# Patient Record
Sex: Female | Born: 1985 | Race: Black or African American | Hispanic: No | Marital: Single | Smoking: Former smoker
Health system: Southern US, Community
[De-identification: ages and names within clinical notes are randomized; demographics above are authoritative.]

## PROBLEM LIST (undated history)

## (undated) DIAGNOSIS — F419 Anxiety disorder, unspecified: Secondary | ICD-10-CM

## (undated) DIAGNOSIS — F17200 Nicotine dependence, unspecified, uncomplicated: Secondary | ICD-10-CM

## (undated) DIAGNOSIS — Z87440 Personal history of urinary (tract) infections: Secondary | ICD-10-CM

## (undated) HISTORY — DX: Nicotine dependence, unspecified, uncomplicated: F17.200

## (undated) HISTORY — DX: Personal history of urinary (tract) infections: Z87.440

---
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
## 2006-04-04 ENCOUNTER — Emergency Department (HOSPITAL_COMMUNITY): Admission: EM | Admit: 2006-04-04 | Discharge: 2006-04-04 | Payer: Self-pay | Admitting: Emergency Medicine

## 2007-01-17 ENCOUNTER — Ambulatory Visit: Payer: Self-pay | Admitting: Family Medicine

## 2007-02-17 ENCOUNTER — Ambulatory Visit: Payer: Self-pay | Admitting: Family Medicine

## 2007-03-24 ENCOUNTER — Ambulatory Visit: Payer: Self-pay | Admitting: Family Medicine

## 2007-08-15 ENCOUNTER — Ambulatory Visit: Payer: Self-pay | Admitting: Family Medicine

## 2007-10-05 ENCOUNTER — Other Ambulatory Visit: Admission: RE | Admit: 2007-10-05 | Discharge: 2007-10-05 | Payer: Self-pay | Admitting: Family Medicine

## 2007-10-05 ENCOUNTER — Ambulatory Visit: Payer: Self-pay | Admitting: Family Medicine

## 2008-11-01 ENCOUNTER — Ambulatory Visit: Payer: Self-pay | Admitting: Family Medicine

## 2009-01-08 ENCOUNTER — Ambulatory Visit: Payer: Self-pay | Admitting: Family Medicine

## 2009-01-08 ENCOUNTER — Other Ambulatory Visit: Admission: RE | Admit: 2009-01-08 | Discharge: 2009-01-08 | Payer: Self-pay | Admitting: Family Medicine

## 2009-01-08 LAB — HM PAP SMEAR: HM Pap smear: NEGATIVE

## 2010-06-07 ENCOUNTER — Encounter: Payer: Self-pay | Admitting: Internal Medicine

## 2010-09-23 ENCOUNTER — Emergency Department (HOSPITAL_COMMUNITY)
Admission: EM | Admit: 2010-09-23 | Discharge: 2010-09-23 | Disposition: A | Discharge status: Home / Self Care | Payer: Federal, State, Local not specified - PPO | Attending: Emergency Medicine | Admitting: Emergency Medicine

## 2010-09-23 DIAGNOSIS — M545 Low back pain, unspecified: Secondary | ICD-10-CM | POA: Insufficient documentation

## 2010-09-23 DIAGNOSIS — N390 Urinary tract infection, site not specified: Secondary | ICD-10-CM | POA: Insufficient documentation

## 2010-09-23 DIAGNOSIS — R300 Dysuria: Secondary | ICD-10-CM | POA: Insufficient documentation

## 2010-09-23 LAB — URINALYSIS, ROUTINE W REFLEX MICROSCOPIC
Bilirubin Urine: NEGATIVE
Glucose, UA: NEGATIVE mg/dL
Ketones, ur: NEGATIVE mg/dL
Nitrite: NEGATIVE
Protein, ur: 30 mg/dL — AB
Specific Gravity, Urine: 1.014 (ref 1.005–1.030)
Urobilinogen, UA: 0.2 mg/dL (ref 0.0–1.0)
pH: 6.5 (ref 5.0–8.0)

## 2010-09-23 LAB — URINE MICROSCOPIC-ADD ON

## 2010-09-23 LAB — POCT PREGNANCY, URINE: Preg Test, Ur: NEGATIVE

## 2010-10-07 ENCOUNTER — Other Ambulatory Visit: Payer: Self-pay | Admitting: Family Medicine

## 2010-10-07 ENCOUNTER — Encounter: Payer: Self-pay | Admitting: Medical

## 2010-10-07 ENCOUNTER — Ambulatory Visit (INDEPENDENT_AMBULATORY_CARE_PROVIDER_SITE_OTHER): Payer: Federal, State, Local not specified - PPO | Admitting: Medical

## 2010-10-07 DIAGNOSIS — L738 Other specified follicular disorders: Secondary | ICD-10-CM

## 2010-10-07 DIAGNOSIS — L739 Follicular disorder, unspecified: Secondary | ICD-10-CM

## 2010-10-07 DIAGNOSIS — R109 Unspecified abdominal pain: Secondary | ICD-10-CM

## 2010-10-07 DIAGNOSIS — N3090 Cystitis, unspecified without hematuria: Secondary | ICD-10-CM

## 2010-10-07 LAB — POCT URINALYSIS DIPSTICK
Leukocytes, UA: POSITIVE
Protein, UA: POSITIVE
Urobilinogen, UA: 1
pH, UA: 5

## 2010-10-07 MED ORDER — SULFAMETHOXAZOLE-TRIMETHOPRIM 400-80 MG PO TABS: 1.0000 | ORAL_TABLET | Freq: Two times a day (BID) | ORAL | Status: AC

## 2010-10-07 NOTE — Patient Instructions (Signed)
Urinary Tract Infection (UTI)   Infections of the urinary tract can start in several places. A bladder infection (cystitis), a kidney infection (pyelonephritis), and a prostate infection (prostatitis) are different types of urinary tract infections. They usually get better if treated with medicines (antibiotics) that kill germs. Take all the medicine until it is gone. You or your child may feel better in a few days, but TAKE ALL MEDICINE or the infection may not respond and may become more difficult to treat.   HOME CARE INSTRUCTIONS   Drink enough water and fluids to keep the urine clear or pale yellow. Cranberry juice is especially recommended, in addition to large amounts of water.   Avoid caffeine, tea, and carbonated beverages. They tend to irritate the bladder.   Alcohol may irritate the prostate.   Only take over-the-counter or prescription medicines for pain, discomfort, or fever as directed by your caregiver.   FINDING OUT THE RESULTS OF YOUR TEST   Not all test results are available during your visit. If your or your child's test results are not back during the visit, make an appointment with your caregiver to find out the results. Do not assume everything is normal if you have not heard from your caregiver or the medical facility. It is important for you to follow up on all test results.   TO PREVENT FURTHER INFECTIONS:   Empty the bladder often. Avoid holding urine for long periods of time.   After a bowel movement, women should cleanse from front to back. Use each tissue only once.   Empty the bladder before and after sexual intercourse.   SEEK MEDICAL CARE IF:   There is back pain.   You or your child has an oral temperature above 101.   Your baby is older than 3 months with a rectal temperature of 100.5º F (38.1° C) or higher for more than 1 day.   Your or your child's problems (symptoms) are no better in 3 days. Return sooner if you or your child is getting worse.   SEEK IMMEDIATE MEDICAL CARE IF:    There is severe back pain or lower abdominal pain.   You or your child develops chills.   You or your child has an oral temperature above 101, not controlled by medicine.   Your baby is older than 3 months with a rectal temperature of 102º F (38.9º C) or higher.   Your baby is 3 months old or younger with a rectal temperature of 100.4º F (38º C) or higher.   There is nausea or vomiting.   There is continued burning or discomfort with urination.   MAKE SURE YOU:   Understand these instructions.   Will watch this condition.   Will get help right away if you or your child is not doing well or gets worse.   Document Released: 02/10/2005 Document Re-Released: 07/28/2009   [employer]® Patient Information ©2011 [employer].

## 2010-10-07 NOTE — Progress Notes (Signed)
Subjective:    Dana Wells is a 25 y.o. female who complains of burning with urination, dysuria, frequency, hematuria and urgency. She has had symptoms for 1 week. Patient also complains of Lower abdominal pain and back pain. Patient denies fever and vaginal discharge. Patient does have a history of recurrent UTI. Patient does not have a history of pyelonephritis. She noticed that she was seen at the emergency department a week and a half ago for the same reason, placed on an antibiotic that she takes 4 times a day, but this has not helped. She continues to have the same symptoms. She notes having frequent urinary tract infections starting as a teenager, gets UTIs one to 2 times per year now, and has never seen a urologist.  She has a second complaint of a rash. She noticed the rashes under her breasts as well as in the pubic area. She gets these from time to time, occasionally there is some discharge from the rash, and thinks is related to sweating. She uses baby powders in these areas, but no other aggravating or relieving factors.  The following portions of the patient's history were reviewed and updated as appropriate: allergies, current medications, past family history, past medical history, past social history, past surgical history and problem list.  Review of Systems Constitutional: denies fever, chills, sweats Cardiology: denies chest pain, palpitations Respiratory: denies cough, shortness of breath Gastroenterology: denies abdominal pain, nausea, vomiting, diarrhea Urology: denies incontinence    Objective:   Filed Vitals:   10/07/10 1004  BP: 136/90  Pulse: 88  Temp: 97.5 F (36.4 C)    General appearence: alert, no distress, WD/WN, African American female Oral cavity: MMM Skin: Linear row of maculopapular 1 cm diameter erythematous lesions under both breasts and in the suprapubic region, no vesicles, no pustules, no fluctuance or induration Heart: RRR, normal S1, S2, no  murmurs Lungs: CTA bilaterally, no wheezes, rhonchi, or rales Abdomen: Positive suprapubic tenderness; +bs, soft, non distended, no masses, no hepatomegaly, no splenomegaly Back: no CVA tenderness Pulses: 2+ symmetric     Laboratory:  Urine dipstick:   Positive for blood, protein, leukocytes.   Assessment:   Encounter Diagnoses  Name Primary?  . Cystitis Yes  . Abdominal pain   . Folliculitis      Plan:     cystitis-prescribed Bactrim, hydrate well, discussed preventative measures, recheck in 2 weeks  Abdominal pain-recheck in 2 weeks  Folliculitis-resolving. Can continue to use drying powders to reduce moisture in the affected areas.

## 2010-10-09 LAB — URINE CULTURE

## 2010-10-13 ENCOUNTER — Ambulatory Visit: Payer: Federal, State, Local not specified - PPO | Admitting: Medical

## 2010-10-13 ENCOUNTER — Encounter: Payer: Self-pay | Admitting: Medical

## 2010-10-14 ENCOUNTER — Telehealth: Payer: Self-pay

## 2010-10-14 NOTE — Telephone Encounter (Signed)
Have called pt 2 times phone has stayed busy cell and work # is no good

## 2010-10-19 ENCOUNTER — Telehealth: Payer: Self-pay

## 2010-10-19 NOTE — Telephone Encounter (Signed)
Called pt 3 times and no response to calls that she needed to come in and redo ua

## 2010-10-21 ENCOUNTER — Ambulatory Visit: Payer: Federal, State, Local not specified - PPO | Admitting: Medical

## 2010-10-22 ENCOUNTER — Ambulatory Visit: Payer: Federal, State, Local not specified - PPO | Admitting: Medical

## 2010-10-26 ENCOUNTER — Encounter: Payer: Self-pay | Admitting: Family Medicine

## 2010-10-26 ENCOUNTER — Ambulatory Visit (INDEPENDENT_AMBULATORY_CARE_PROVIDER_SITE_OTHER): Payer: Federal, State, Local not specified - PPO | Admitting: Family Medicine

## 2010-10-26 ENCOUNTER — Ambulatory Visit: Payer: Federal, State, Local not specified - PPO | Admitting: Family Medicine

## 2010-10-26 VITALS — BP 130/80 | HR 70 | Temp 98.3°F | Wt 172.0 lb

## 2010-10-26 DIAGNOSIS — L0291 Cutaneous abscess, unspecified: Secondary | ICD-10-CM

## 2010-10-26 DIAGNOSIS — N390 Urinary tract infection, site not specified: Secondary | ICD-10-CM

## 2010-10-26 DIAGNOSIS — L0390 Cellulitis, unspecified: Secondary | ICD-10-CM

## 2010-10-26 DIAGNOSIS — M545 Low back pain: Secondary | ICD-10-CM

## 2010-10-26 LAB — POCT URINALYSIS DIPSTICK
Bilirubin, UA: NEGATIVE
Ketones, UA: NEGATIVE
Spec Grav, UA: 1.02
pH, UA: 5

## 2010-10-26 MED ORDER — DOXYCYCLINE HYCLATE 100 MG PO TABS: 100.0000 mg | ORAL_TABLET | Freq: Two times a day (BID) | ORAL | Status: AC

## 2010-10-26 NOTE — Progress Notes (Signed)
  Subjective:    Patient ID: Dana Wells, female    DOB: [birth year], 25 y.o.   MRN: [id number]  HPI she is here for a recheck. He states that the dysuria has diminished however she continues to have difficulty with back pain. The back pain is made worse with physical activities with motion in any direction. She has no numbness tingling or weakness. She also continues to have difficulty with lesions under her breasts and states the antibiotic did not help with this.    Review of Systems     Objective:   Physical Exam alert and in no distress. Lesions in various stages of healing are noted underneath both breasts. Underneath left breast there was one that had come to a head.   urinalysis microscopic was contaminated. Back exam shows tenderness to palpation over her lumbar area as well as SI joint. Good motion of the back the pain with motion. Negative straight leg raising. Normal DTRs.        Assessment & Plan:   mechanical low back pain. Multiple abscesses. I instructed her on proper care of her back in terms of lifting sitting standing. Recommend heat and ibuprofen. I will also place her on doxycycline. A culture was taken from the lesion under the left breast.

## 2010-10-26 NOTE — Patient Instructions (Addendum)
Take 4 Advil 3 times per day. Also heat to back for 20 minutes 3 times per day. Proper posturing in regard to lifting, sitting and standing. Do this for 2 weeks and if no better make an employment for followup I will call you with the results of the culture. Also use Hibiclens couple times per week

## 2010-10-29 LAB — WOUND CULTURE
Gram Stain: NONE SEEN
Gram Stain: NONE SEEN
Organism ID, Bacteria: NO GROWTH

## 2010-12-07 ENCOUNTER — Ambulatory Visit: Payer: Federal, State, Local not specified - PPO | Admitting: Family Medicine

## 2010-12-11 ENCOUNTER — Ambulatory Visit: Payer: Federal, State, Local not specified - PPO | Admitting: Family Medicine

## 2011-01-28 ENCOUNTER — Telehealth: Payer: Self-pay | Admitting: Family Medicine

## 2011-01-28 NOTE — Telephone Encounter (Signed)
LETTER SENT

## 2011-02-16 ENCOUNTER — Ambulatory Visit (INDEPENDENT_AMBULATORY_CARE_PROVIDER_SITE_OTHER): Payer: Federal, State, Local not specified - PPO | Admitting: Medical

## 2011-02-16 ENCOUNTER — Encounter: Payer: Self-pay | Admitting: Medical

## 2011-02-16 VITALS — BP 120/80 | HR 80 | Temp 98.4°F | Resp 16 | Ht 68.0 in | Wt 175.0 lb

## 2011-02-16 DIAGNOSIS — J029 Acute pharyngitis, unspecified: Secondary | ICD-10-CM

## 2011-02-16 DIAGNOSIS — R509 Fever, unspecified: Secondary | ICD-10-CM

## 2011-02-16 DIAGNOSIS — R05 Cough: Secondary | ICD-10-CM

## 2011-02-16 LAB — CBC WITH DIFFERENTIAL/PLATELET
Basophils Absolute: 0 10*3/uL (ref 0.0–0.1)
Basophils Relative: 0 % (ref 0–1)
HCT: 37 % (ref 36.0–46.0)
Lymphocytes Relative: 18 % (ref 12–46)
MCHC: 33.8 g/dL (ref 30.0–36.0)
Neutro Abs: 6.7 10*3/uL (ref 1.7–7.7)
Neutrophils Relative %: 75 % (ref 43–77)
RDW: 13.3 % (ref 11.5–15.5)
WBC: 9 10*3/uL (ref 4.0–10.5)

## 2011-02-16 MED ORDER — ANTIPYRINE-BENZOCAINE 5.4-1.4 % OT SOLN: 3.0000 [drp] | OTIC | Status: AC | PRN

## 2011-02-16 MED ORDER — PROMETHAZINE-DM 6.25-15 MG/5ML PO SYRP: ORAL_SOLUTION | ORAL | Status: DC

## 2011-02-16 NOTE — Progress Notes (Signed)
Subjective:     Dana Wells is a 25 y.o. female who presents for evaluation of chills, fever, productive cough, sore throat and left ear pain, swollen glands, sinus pressure, headahce, and neck discomfort x 3 days..  Symptoms have been gradually worsening since that time. Treatment to date: Theraflu.  Denies sick contacts.  No other aggravating or relieving factors.  No other c/o.  She has missed school due to symptoms.  She notes that she feels like she has the flu.    The following portions of the patient's history were reviewed and updated as appropriate: allergies, current medications, past family history, past medical history, past social history, past surgical history and problem list.  Past Medical History  Diagnosis Date  . Tobacco use disorder   . History of recurrent urinary tract infection     Review of Systems Constitutional: +low grade fever, chills, anorexia; denies sweats Skin: denies rash HEENT: +sore throat, left ear pain; denies itchy watery eyes Cardiovascular: denies chest pain, palpitations Lungs: +mild cough with some productive sputum; denies wheezing, hemoptysis, orthopnea, PND Abdomen: +nausea; denies abdominal pain, vomiting, diarrhea GU: denies dysuria Extremities: +myalgias; denies edema, arthralgias  Objective:   Filed Vitals:   02/16/11 1444  BP: 120/80  Pulse: 80  Temp: 98.4 F (36.9 C)  Resp: 16    General appearance: Alert, WD/WN, no distress, ill appearing                             Skin: warm, no rash, no diaphoresis                           Head: no sinus tenderness                            Eyes: conjunctiva normal, corneas clear, PERRLA                            Ears: pearly TMs, external ear canals normal                          Nose: septum midline, turbinates swollen, with erythema and clear discharge             Mouth/throat: MMM, tongue normal, mild pharyngeal erythema                           Neck: supple, no adenopathy, no  thyromegaly, nontender                          Heart: RRR, normal S1, S2, no murmurs                         Lungs: CTA, no rhonchi, no wheezes, no rales                Extremities: no edema, nontender Abdomen: nontender, no mass, no organomegaly, non distended     Assessment:   Encounter Diagnoses  Name Primary?  . Pharyngitis Yes  . Fever   . Cough     Plan:   Prescription given today for Promethazine DM and Auralgan drops as below.  Strep and flu rapid tests negative.   Will send CBC.  Discussed diagnosis and treatment of what appears to be viral/flu like illness.  Suggested symptomatic OTC remedies for cough and congestion.  Nasal saline spray for nasal congestion.  Tylenol or Ibuprofen OTC for fever and malaise.  Call/return in 2-3 days if symptoms are worse or not improving. Gave note for school.

## 2011-02-16 NOTE — Patient Instructions (Signed)
Rest, hydrate well with water, Gatorade, ice chips, etc.    Use 3 tablets of the OTC Ibuprofen for neck and throat pain.  I wrote 2 prescriptions today, 1 is an ear drop for pain, the other is a syrup for cough and nausea.   Call if worse or not improving by Friday.

## 2011-02-18 ENCOUNTER — Telehealth: Payer: Self-pay | Admitting: Medical

## 2011-02-18 ENCOUNTER — Emergency Department (HOSPITAL_COMMUNITY)
Admission: EM | Admit: 2011-02-18 | Discharge: 2011-02-19 | Disposition: A | Discharge status: Home / Self Care | Payer: Federal, State, Local not specified - PPO | Attending: Emergency Medicine | Admitting: Emergency Medicine

## 2011-02-18 DIAGNOSIS — R51 Headache: Secondary | ICD-10-CM | POA: Insufficient documentation

## 2011-02-18 NOTE — Progress Notes (Signed)
Pt called headaches are uncontrollable and her 3 Ibuprofen per day not working.  Please call her in something stronger to [facility] or advise her how to handle the HA.  Please let pt know either way.  Advised her of lab results.

## 2011-02-18 NOTE — Telephone Encounter (Signed)
She called and stated that she is feeling a little better, threw up the cough syrup once, but wants something for headache.  She will take Ibuprofen 200mg , 4 tablets every 6 hours for the next 24 hours prn.  Call if worse or not improving.

## 2011-02-19 ENCOUNTER — Encounter (HOSPITAL_COMMUNITY): Payer: Self-pay

## 2011-02-19 ENCOUNTER — Emergency Department (HOSPITAL_COMMUNITY): Payer: Federal, State, Local not specified - PPO

## 2011-02-19 LAB — BASIC METABOLIC PANEL
CO2: 26 mEq/L (ref 19–32)
Calcium: 9.6 mg/dL (ref 8.4–10.5)
Glucose, Bld: 89 mg/dL (ref 70–99)
Sodium: 137 mEq/L (ref 135–145)

## 2011-02-19 LAB — CBC
HCT: 37.1 % (ref 36.0–46.0)
Hemoglobin: 12.6 g/dL (ref 12.0–15.0)
MCH: 30 pg (ref 26.0–34.0)
MCV: 88.3 fL (ref 78.0–100.0)
Platelets: 379 10*3/uL (ref 150–400)
RBC: 4.2 MIL/uL (ref 3.87–5.11)

## 2011-02-20 ENCOUNTER — Emergency Department (HOSPITAL_COMMUNITY)
Admission: EM | Admit: 2011-02-20 | Discharge: 2011-02-20 | Disposition: A | Discharge status: Home / Self Care | Payer: Federal, State, Local not specified - PPO | Attending: Emergency Medicine | Admitting: Emergency Medicine

## 2011-02-20 DIAGNOSIS — Z79899 Other long term (current) drug therapy: Secondary | ICD-10-CM | POA: Insufficient documentation

## 2011-02-20 DIAGNOSIS — R51 Headache: Secondary | ICD-10-CM | POA: Insufficient documentation

## 2011-02-20 DIAGNOSIS — R112 Nausea with vomiting, unspecified: Secondary | ICD-10-CM | POA: Insufficient documentation

## 2011-02-20 LAB — POCT PREGNANCY, URINE: Preg Test, Ur: NEGATIVE

## 2011-02-24 ENCOUNTER — Encounter: Payer: Federal, State, Local not specified - PPO | Admitting: Family Medicine

## 2011-06-26 ENCOUNTER — Encounter (HOSPITAL_COMMUNITY): Payer: Self-pay | Admitting: *Deleted

## 2011-06-26 ENCOUNTER — Emergency Department (HOSPITAL_COMMUNITY)
Admission: EM | Admit: 2011-06-26 | Discharge: 2011-06-26 | Disposition: A | Discharge status: Home / Self Care | Payer: Federal, State, Local not specified - PPO | Attending: Emergency Medicine | Admitting: Emergency Medicine

## 2011-06-26 DIAGNOSIS — R300 Dysuria: Secondary | ICD-10-CM | POA: Insufficient documentation

## 2011-06-26 DIAGNOSIS — N390 Urinary tract infection, site not specified: Secondary | ICD-10-CM

## 2011-06-26 DIAGNOSIS — R109 Unspecified abdominal pain: Secondary | ICD-10-CM | POA: Insufficient documentation

## 2011-06-26 DIAGNOSIS — R10819 Abdominal tenderness, unspecified site: Secondary | ICD-10-CM | POA: Insufficient documentation

## 2011-06-26 DIAGNOSIS — R319 Hematuria, unspecified: Secondary | ICD-10-CM | POA: Insufficient documentation

## 2011-06-26 DIAGNOSIS — F17200 Nicotine dependence, unspecified, uncomplicated: Secondary | ICD-10-CM | POA: Insufficient documentation

## 2011-06-26 LAB — URINALYSIS, ROUTINE W REFLEX MICROSCOPIC
Glucose, UA: NEGATIVE mg/dL
Ketones, ur: NEGATIVE mg/dL
Nitrite: NEGATIVE
Specific Gravity, Urine: 1.025 (ref 1.005–1.030)
pH: 6.5 (ref 5.0–8.0)

## 2011-06-26 LAB — URINE MICROSCOPIC-ADD ON

## 2011-06-26 LAB — POCT PREGNANCY, URINE: Preg Test, Ur: NEGATIVE

## 2011-06-26 MED ORDER — CIPROFLOXACIN HCL 500 MG PO TABS: 500.0000 mg | ORAL_TABLET | Freq: Two times a day (BID) | ORAL | Status: DC

## 2011-06-26 MED ORDER — IBUPROFEN 800 MG PO TABS
800.0000 mg | ORAL_TABLET | Freq: Once | ORAL | Status: AC
Start: 2011-06-26 — End: 2011-06-26
  Administered 2011-06-26: 800 mg via ORAL
  Filled 2011-06-26: qty 1

## 2011-06-26 MED ORDER — HYDROCODONE-ACETAMINOPHEN 5-325 MG PO TABS: 1.0000 | ORAL_TABLET | Freq: Four times a day (QID) | ORAL | Status: AC | PRN

## 2011-06-26 MED ORDER — KETOROLAC TROMETHAMINE 60 MG/2ML IM SOLN
60.0000 mg | Freq: Once | INTRAMUSCULAR | Status: DC
  Filled 2011-06-26: qty 2

## 2011-06-26 NOTE — ED Provider Notes (Signed)
History     CSN: [id number]  Arrival date & time 06/26/11  1142   First MD Initiated Contact with Patient 06/26/11 1150      Chief Complaint  Patient presents with  . Flank Pain    Left  . Dysuria    (Consider location/radiation/quality/duration/timing/severity/associated sxs/prior treatment) HPI Comments: Patient reports several days of left flank pain, now with urinary pressure, dysuria, and hematuria.  The flank pain is sharp and constant, worse with urination.  States this feels like her previous UTI.  States she has been getting UTIs approximately twice a year since she was 26 years old, has never seen a urologist.  Denies fever, N/V, change in bowel habits including diarrhea, constipation, melena, hematochezia, denies abnormal vaginal discharge or bleeding.  LMP was 2 weeks ago.  The history is provided by the patient.    Past Medical History  Diagnosis Date  . Tobacco use disorder   . History of recurrent urinary tract infection     History reviewed. No pertinent past surgical history.  History reviewed. No pertinent family history.  History  Substance Use Topics  . Smoking status: Current Everyday Smoker -- 0.5 packs/day    Types: Cigarettes  . Smokeless tobacco: Never Used  . Alcohol Use: 0.5 oz/week    1 drink(s) per week     socially    OB History    Grav Para Term Preterm Abortions TAB SAB Ect Mult Living                  Review of Systems  Respiratory: Negative for cough and shortness of breath.   Cardiovascular: Negative for chest pain.  All other systems reviewed and are negative.    Allergies  Food allergy formula  Home Medications   Current Outpatient Rx  Name Route Sig Dispense Refill  . ACETAMINOPHEN 500 MG PO TABS Oral Take 500 mg by mouth every 6 (six) hours as needed. pain    . IBUPROFEN 200 MG PO TABS Oral Take 400 mg by mouth every 6 (six) hours as needed. pain    . NAPROXEN SODIUM 220 MG PO TABS Oral Take 220 mg by mouth 2 (two)  times daily with a meal.      BP 111/66  Pulse 88  Temp(Src) 98.6 F (37 C) (Oral)  Resp 16  Wt 175 lb (79.379 kg)  SpO2 100%  LMP 06/16/2011  Physical Exam  Nursing note and vitals reviewed. Constitutional: She is oriented to person, place, and time. She appears well-developed and well-nourished.  HENT:  Head: Normocephalic and atraumatic.  Neck: Neck supple.  Cardiovascular: Normal rate, regular rhythm and normal heart sounds.   Pulmonary/Chest: Breath sounds normal. No respiratory distress. She has no wheezes. She has no rales. She exhibits no tenderness.  Abdominal: Soft. Bowel sounds are normal. She exhibits no distension and no mass. There is tenderness. There is CVA tenderness. There is no rebound and no guarding.       Diffuse left sided abdominal tenderness.  No localized tenderness.  Bilateral CVA tenderness.    Neurological: She is alert and oriented to person, place, and time.  Psychiatric: She has a normal mood and affect. Her behavior is normal.    ED Course  Procedures (including critical care time)  Labs Reviewed  URINALYSIS, ROUTINE W REFLEX MICROSCOPIC - Abnormal; Notable for the following:    APPearance TURBID (*)    Hgb urine dipstick LARGE (*)    Protein, ur 100 (*)  Leukocytes, UA LARGE (*)    All other components within normal limits  URINE MICROSCOPIC-ADD ON - Abnormal; Notable for the following:    Bacteria, UA FEW (*)    All other components within normal limits  POCT PREGNANCY, URINE  URINE CULTURE   No results found.   1. UTI (lower urinary tract infection)       MDM  Nontoxic, afebrile patient with recurrent UTIs presents with constant left flank pain, urinary "pressure," dysuria, hematuria.  No fever, vomiting, vaginal or bowel symptoms.  Patient d/c home with antibiotics, pain medication, urology follow up for investigation of recurrent infections.  Pt to return for worsening symptoms.  Patient verbalizes understanding and agrees  with plan. Rise Patience, [place] 06/26/11 1547

## 2011-06-26 NOTE — ED Provider Notes (Signed)
26 year old female with left flank pain for 3 days and dysuria today. She has not had any fever or chills and has not had any nausea or vomiting. Urinalysis confirms UTI. She is sent home on antibiotics.  Dione Booze, MD 06/26/11 1328

## 2011-06-26 NOTE — ED Notes (Signed)
Pt from home c/o left flank pain and dysuria since yesterday. Pt denies N/V/D.

## 2011-06-27 NOTE — ED Provider Notes (Signed)
Medical screening examination/treatment/procedure(s) were performed by non-physician practitioner and as supervising physician I was immediately available for consultation/collaboration.   Dione Booze, MD 06/27/11 [phone]

## 2011-06-30 ENCOUNTER — Other Ambulatory Visit: Payer: Self-pay | Admitting: Family Medicine

## 2011-06-30 ENCOUNTER — Encounter: Payer: Self-pay | Admitting: Family Medicine

## 2011-06-30 ENCOUNTER — Other Ambulatory Visit (HOSPITAL_COMMUNITY)
Admission: RE | Admit: 2011-06-30 | Discharge: 2011-06-30 | Disposition: A | Discharge status: Home / Self Care | Payer: Federal, State, Local not specified - PPO | Source: Ambulatory Visit | Attending: Family Medicine | Admitting: Family Medicine

## 2011-06-30 ENCOUNTER — Ambulatory Visit (INDEPENDENT_AMBULATORY_CARE_PROVIDER_SITE_OTHER): Payer: Federal, State, Local not specified - PPO | Admitting: Family Medicine

## 2011-06-30 VITALS — BP 120/70 | HR 70 | Ht 68.0 in | Wt 184.0 lb

## 2011-06-30 DIAGNOSIS — L089 Local infection of the skin and subcutaneous tissue, unspecified: Secondary | ICD-10-CM

## 2011-06-30 DIAGNOSIS — Z87440 Personal history of urinary (tract) infections: Secondary | ICD-10-CM

## 2011-06-30 DIAGNOSIS — Z0000 Encounter for general adult medical examination without abnormal findings: Secondary | ICD-10-CM

## 2011-06-30 DIAGNOSIS — L723 Sebaceous cyst: Secondary | ICD-10-CM

## 2011-06-30 DIAGNOSIS — Z01419 Encounter for gynecological examination (general) (routine) without abnormal findings: Secondary | ICD-10-CM | POA: Insufficient documentation

## 2011-06-30 MED ORDER — DOXYCYCLINE HYCLATE 100 MG PO TABS: 100.0000 mg | ORAL_TABLET | Freq: Two times a day (BID) | ORAL | Status: AC

## 2011-06-30 NOTE — Progress Notes (Signed)
Subjective:    Patient ID: Dana Wells, female    DOB: May 16, 1986, 26 y.o.   MRN: [id number]  HPI She has a two-week history of bilateral flank pain is made worse with standing or sitting for more than 10 or 20 minutes. No nausea, vomiting, diarrhea. No affect by food or bowel movements. She does occasionally take at all and gets some benefit from this. She also has a history of having 3 UTIs since August of last year. The most recent evaluation was in the emergency room. That record was reviewed. She also has a history of folliculitis and was seen by dermatology and placed on doxycycline. She mainly complains of lesions under both breasts and in the inguinal area.   Review of Systems  Constitutional: Negative.   HENT: Negative.   Eyes: Negative.   Respiratory: Negative.   Cardiovascular: Negative.   Genitourinary: Positive for dysuria, frequency, flank pain and difficulty urinating.  Musculoskeletal: Negative.   Skin: Negative.   Psychiatric/Behavioral: Negative.        Objective:   Physical Exam BP 120/70  Pulse 70  Ht 5\' 8"  (1.727 m)  Wt 184 lb (83.462 kg)  BMI 27.98 kg/m2  LMP 06/16/2011  General Appearance:    Alert, cooperative, no distress, appears stated age  Head:    Normocephalic, without obvious abnormality, atraumatic  Eyes:    PERRL, conjunctiva/corneas clear, EOM's intact, fundi    benign  Ears:    Normal TM's and external ear canals  Nose:   Nares normal, mucosa normal, no drainage or sinus   tenderness  Throat:   Lips, mucosa, and tongue normal; teeth and gums normal  Neck:   Supple, no lymphadenopathy;  thyroid:  no   enlargement/tenderness/nodules; no carotid   bruit or JVD  Back:    Spine nontender, no curvature, ROM normal, no CVA     tenderness  Lungs:     Clear to auscultation bilaterally without wheezes, rales or     ronchi; respirations unlabored  Chest Wall:    No tenderness or deformity   Heart:    Regular rate and rhythm, S1 and S2 normal, no  murmur, rub   or gallop  Breast Exam:    No tenderness, masses, or nipple discharge or inversion.      No axillary lymphadenopathy  Abdomen:     Soft, non-tender, nondistended, normoactive bowel sounds,    no masses, no hepatosplenomegaly  Genitalia:    Normal external genitalia without lesions.  BUS and vagina normal; cervix without lesions, or cervical motion tenderness. No abnormal vaginal discharge.  Uterus and adnexa not enlarged, nontender, no masses.  Pap performed  Rectal:    Not performed due to age<40 and no related complaints  Extremities:   No clubbing, cyanosis or edema  Pulses:   2+ and symmetric all extremities  Skin:   Skin color, texture, turgor normal, multiple sebaceous lesions are noted under both breasts and in the  Inner thighs bilaterally. 1 on the right eye was quite red and tender. Purulent material was expressed from this lesion it was cultured   Lymph nodes:   Cervical, supraclavicular, and axillary nodes normal  Neurologic:   CNII-XII intact, normal strength, sensation and gait; reflexes 2+ and symmetric throughout          Psych:   Normal mood, affect, hygiene and grooming.           Assessment & Plan:   1. Infected sebaceous cyst  Culture, routine-abscess  2. History of UTI    3. Routine general medical examination at a health care facility  Cytology - PAP   discuss her smoking and recommend she quit. She will continue to use condoms for birth control since she does not want to be placed on birth control pills. We'll give her doxycycline. Instructed her to call me if any of these lesions get larger and need to be opened. Recommend Advil for her pain. She is to return here in 10 days for repeat urinalysis.

## 2011-06-30 NOTE — Patient Instructions (Addendum)
Take all the antibiotic and I do want to have any left over. Use Dial soap. If the cysts under your breasts get worse call me for an appointment Take Advil for your pain. You can take as many as 12 per day if you need to.

## 2011-07-03 LAB — CULTURE, ROUTINE-ABSCESS: Gram Stain: NONE SEEN

## 2011-07-05 ENCOUNTER — Encounter: Payer: Self-pay | Admitting: Internal Medicine

## 2011-07-12 ENCOUNTER — Other Ambulatory Visit: Payer: Federal, State, Local not specified - PPO

## 2011-08-02 DIAGNOSIS — Z0279 Encounter for issue of other medical certificate: Secondary | ICD-10-CM

## 2011-08-16 HISTORY — PX: THERAPEUTIC ABORTION: SHX798

## 2011-08-23 ENCOUNTER — Ambulatory Visit (INDEPENDENT_AMBULATORY_CARE_PROVIDER_SITE_OTHER): Payer: Federal, State, Local not specified - PPO | Admitting: Family Medicine

## 2011-08-23 ENCOUNTER — Ambulatory Visit: Payer: Federal, State, Local not specified - PPO | Admitting: Family Medicine

## 2011-08-23 ENCOUNTER — Encounter: Payer: Self-pay | Admitting: Family Medicine

## 2011-08-23 VITALS — BP 120/74 | HR 108 | Wt 191.0 lb

## 2011-08-23 DIAGNOSIS — N912 Amenorrhea, unspecified: Secondary | ICD-10-CM

## 2011-08-23 DIAGNOSIS — Z331 Pregnant state, incidental: Secondary | ICD-10-CM

## 2011-08-23 DIAGNOSIS — N390 Urinary tract infection, site not specified: Secondary | ICD-10-CM

## 2011-08-23 DIAGNOSIS — R319 Hematuria, unspecified: Secondary | ICD-10-CM

## 2011-08-23 LAB — POCT URINALYSIS DIPSTICK
Bilirubin, UA: NEGATIVE
Glucose, UA: NEGATIVE
Nitrite, UA: NEGATIVE
Urobilinogen, UA: NEGATIVE

## 2011-08-23 MED ORDER — CIPROFLOXACIN HCL 500 MG PO TABS: 500.0000 mg | ORAL_TABLET | Freq: Two times a day (BID) | ORAL | Status: AC

## 2011-08-23 NOTE — Progress Notes (Signed)
  Subjective:    Patient ID: Dana Wells, female    DOB: [birth year], 26 y.o.   MRN: [id number]  HPI She is here for evaluation of lower down no pressure and dysuria but no fever or chills. She also missed her last cycle and is concerned about pregnancy. She has never been pregnant before.   Review of Systems     Objective:   Physical Exam Alert and in no distress otherwise not examined       Assessment & Plan:   1. Amenorrhea  POCT urine pregnancy  2. Hematuria  POCT Urinalysis Dipstick  3. IUP (intrauterine pregnancy), incidental    4. UTI (lower urinary tract infection)     discuss her pregnancy with her. I discussed her options with her. She would like to have it terminated. A proper referral will be made. She was also placed on Cipro. She will return here as needed.

## 2011-12-06 ENCOUNTER — Ambulatory Visit (INDEPENDENT_AMBULATORY_CARE_PROVIDER_SITE_OTHER): Payer: Federal, State, Local not specified - PPO | Admitting: Medical

## 2011-12-06 ENCOUNTER — Encounter: Payer: Self-pay | Admitting: Medical

## 2011-12-06 ENCOUNTER — Emergency Department (HOSPITAL_COMMUNITY)
Admission: EM | Admit: 2011-12-06 | Discharge: 2011-12-08 | Disposition: A | Discharge status: Other Acute Inpatient Hospital | Payer: Federal, State, Local not specified - PPO

## 2011-12-06 VITALS — BP 120/70 | HR 82 | Wt 178.0 lb

## 2011-12-06 DIAGNOSIS — Z3201 Encounter for pregnancy test, result positive: Secondary | ICD-10-CM

## 2011-12-06 DIAGNOSIS — R58 Hemorrhage, not elsewhere classified: Secondary | ICD-10-CM

## 2011-12-06 DIAGNOSIS — N949 Unspecified condition associated with female genital organs and menstrual cycle: Secondary | ICD-10-CM

## 2011-12-06 DIAGNOSIS — N938 Other specified abnormal uterine and vaginal bleeding: Secondary | ICD-10-CM

## 2011-12-06 LAB — POCT URINE PREGNANCY: Preg Test, Ur: POSITIVE

## 2011-12-06 NOTE — Progress Notes (Signed)
Subjective:   HPI  Dana Wells is a 26 y.o. female who presents with prolonged menstrual bleeding.   She has a hx/o 1 pregnancy in April with TAB of that pregnancy.  She notes before April 2013 she had been on Nortel OCP for years.   Up until April, periods had always been every 28 days, normal cycle although the first few days always heavy bleeding.  She notes that she had been taking her OCP daily, every day, but not necessarily same time each day.  She ended up getting pregnancy in April while on the pill.  She ended up having a therapeutic abortion in April.  She was then switched to Sprintec which she has been taking every day.  She notes 2 normal periods since beginning Sprintec but last period started the end of June.  It was less heavy, more spotty, but has continued now about 2 weeks.  She stopped her Sprintec 2 days ago due to the bleeding and prolonged period.   She notes similar to April's unplanned pregnancy, she also now has belly tightness and uncomfortable feeling in her abdomen.  She sometimes uses condoms but not recently.  She notes that if she is pregnancy this time she wants to keep the pregnancy.  She has one monogamous partner.  She denies nausea, breast tenderness, or other symptoms. No other aggravating or relieving factors.    No other c/o.  The following portions of the patient's history were reviewed and updated as appropriate: allergies, current medications, past family history, past medical history, past social history, past surgical history and problem list.  Past Medical History  Diagnosis Date  . Tobacco use disorder   . History of recurrent urinary tract infection     Allergies  Allergen Reactions  . Food Allergy Formula Swelling    tomatoes   Review of Systems ROS reviewed and was negative other than noted in HPI or above.    Objective:   Physical Exam  General appearance: alert, no distress, WD/WN Abdomen: +bs, soft, tender left side throughout, but non  distended, no masses, no hepatomegaly, no splenomegaly   Assessment and Plan :     Encounter Diagnoses  Name Primary?  . Dysfunctional uterine bleeding Yes  . Pregnancy test-positive   . Bleeding    Urine prengancy positive today.  Will check beta HCG in light of April's situation with unplanned pregnancy while on OCPs.   She notes the desire to keep this pregnancy if it is positive.  We will have her sign to get copy of ultrasound from April.

## 2011-12-07 LAB — HCG, QUANTITATIVE, PREGNANCY: hCG, Beta Chain, Quant, S: <2 m[IU]/mL

## 2012-02-07 ENCOUNTER — Encounter: Payer: Self-pay | Admitting: Family Medicine

## 2012-02-07 ENCOUNTER — Ambulatory Visit (INDEPENDENT_AMBULATORY_CARE_PROVIDER_SITE_OTHER): Payer: Federal, State, Local not specified - PPO | Admitting: Family Medicine

## 2012-02-07 VITALS — BP 98/70 | HR 72 | Temp 98.6°F | Ht 67.0 in | Wt 173.0 lb

## 2012-02-07 DIAGNOSIS — R1013 Epigastric pain: Secondary | ICD-10-CM

## 2012-02-07 DIAGNOSIS — J069 Acute upper respiratory infection, unspecified: Secondary | ICD-10-CM

## 2012-02-07 DIAGNOSIS — R1110 Vomiting, unspecified: Secondary | ICD-10-CM

## 2012-02-07 DIAGNOSIS — M549 Dorsalgia, unspecified: Secondary | ICD-10-CM

## 2012-02-07 DIAGNOSIS — R51 Headache: Secondary | ICD-10-CM

## 2012-02-07 DIAGNOSIS — R109 Unspecified abdominal pain: Secondary | ICD-10-CM

## 2012-02-07 LAB — POCT URINALYSIS DIPSTICK
Bilirubin, UA: NEGATIVE
Glucose, UA: NEGATIVE
Spec Grav, UA: 1.02
Urobilinogen, UA: NEGATIVE
pH, UA: 5

## 2012-02-07 MED ORDER — ESOMEPRAZOLE MAGNESIUM 40 MG PO CPDR: 40.0000 mg | DELAYED_RELEASE_CAPSULE | Freq: Every day | ORAL | Status: DC

## 2012-02-07 NOTE — Patient Instructions (Addendum)
Headaches--suspect viral with sinus component.  Add decongestants (ie sudafed) and guaifenesin (ie mucinex or robitussin) to help thin out and clear sinuses.  You can also try sinus rinse kit or Neti-pot to help flush out/rinse sinuses and help with the headaches.  Chest pain and shortness of breath--can be due to chest congestion from post nasal drainage and GERD, especially given your pain in upper stomach.  Try and avoid spicy foods, caffeine and if you are taking ibuprofen, make sure to take with food, as this can bother the stomach further.  Take the Nexium samples daily to help make the stomach feel better.  Abnormal urine test--no urinary symptoms.  We will send the urine for culture and will call you if you need antibiotics.  Back pain--there is evidence of muscle spasm.  I recommend use of heat for 15 minutes at least 3 times daily, followed by massage and stretches.  Return in 1-2 weeks if you are having ongoing problems.  I recommend that you take a prenatal vitamin daily, given that you aren't using contraception regularly and could get pregnant.
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
## 2012-02-09 LAB — URINE CULTURE: Colony Count: >=100000

## 2012-02-10 ENCOUNTER — Other Ambulatory Visit (INDEPENDENT_AMBULATORY_CARE_PROVIDER_SITE_OTHER): Payer: Federal, State, Local not specified - PPO

## 2012-02-10 DIAGNOSIS — R350 Frequency of micturition: Secondary | ICD-10-CM

## 2012-02-10 LAB — POCT URINALYSIS DIPSTICK
Nitrite, UA: NEGATIVE
Protein, UA: NEGATIVE
pH, UA: 6

## 2012-02-12 LAB — CULTURE, URINE COMPREHENSIVE: Colony Count: 30000

## 2012-02-14 ENCOUNTER — Telehealth: Payer: Self-pay | Admitting: *Deleted

## 2012-02-14 NOTE — Telephone Encounter (Signed)
Patient notified of her culture results.

## 2012-02-14 NOTE — Telephone Encounter (Signed)
Patient called requesting urine culture results.

## 2012-02-14 NOTE — Telephone Encounter (Signed)
Advise pt that no significant infection is noted

## 2012-03-17 ENCOUNTER — Ambulatory Visit: Payer: Federal, State, Local not specified - PPO | Admitting: Medical

## 2012-04-11 ENCOUNTER — Encounter (HOSPITAL_COMMUNITY): Payer: Self-pay | Admitting: *Deleted

## 2012-04-11 ENCOUNTER — Emergency Department (HOSPITAL_COMMUNITY)
Admission: EM | Admit: 2012-04-11 | Discharge: 2012-04-11 | Disposition: A | Discharge status: Home / Self Care | Payer: Federal, State, Local not specified - PPO | Attending: Emergency Medicine | Admitting: Emergency Medicine

## 2012-04-11 DIAGNOSIS — R1013 Epigastric pain: Secondary | ICD-10-CM | POA: Insufficient documentation

## 2012-04-11 DIAGNOSIS — Z3202 Encounter for pregnancy test, result negative: Secondary | ICD-10-CM | POA: Insufficient documentation

## 2012-04-11 DIAGNOSIS — H53149 Visual discomfort, unspecified: Secondary | ICD-10-CM | POA: Insufficient documentation

## 2012-04-11 DIAGNOSIS — N390 Urinary tract infection, site not specified: Secondary | ICD-10-CM

## 2012-04-11 DIAGNOSIS — Z87891 Personal history of nicotine dependence: Secondary | ICD-10-CM | POA: Insufficient documentation

## 2012-04-11 DIAGNOSIS — Z87440 Personal history of urinary (tract) infections: Secondary | ICD-10-CM | POA: Insufficient documentation

## 2012-04-11 DIAGNOSIS — R197 Diarrhea, unspecified: Secondary | ICD-10-CM | POA: Insufficient documentation

## 2012-04-11 DIAGNOSIS — R51 Headache: Secondary | ICD-10-CM | POA: Insufficient documentation

## 2012-04-11 LAB — CBC WITH DIFFERENTIAL/PLATELET
Basophils Absolute: 0 10*3/uL (ref 0.0–0.1)
Basophils Relative: 0 % (ref 0–1)
Eosinophils Absolute: 0.1 10*3/uL (ref 0.0–0.7)
Eosinophils Relative: 2 % (ref 0–5)
HCT: 38.4 % (ref 36.0–46.0)
Lymphocytes Relative: 22 % (ref 12–46)
MCH: 30.2 pg (ref 26.0–34.0)
MCHC: 34.4 g/dL (ref 30.0–36.0)
MCV: 87.9 fL (ref 78.0–100.0)
Monocytes Absolute: 0.4 10*3/uL (ref 0.1–1.0)
RDW: 13 % (ref 11.5–15.5)

## 2012-04-11 LAB — COMPREHENSIVE METABOLIC PANEL
ALT: 10 U/L (ref 0–35)
AST: 16 U/L (ref 0–37)
Albumin: 3.8 g/dL (ref 3.5–5.2)
CO2: 27 mEq/L (ref 19–32)
Chloride: 103 mEq/L (ref 96–112)
GFR calc non Af Amer: >90 mL/min (ref >90)
Sodium: 139 mEq/L (ref 135–145)
Total Bilirubin: 0.5 mg/dL (ref 0.3–1.2)

## 2012-04-11 LAB — URINE MICROSCOPIC-ADD ON

## 2012-04-11 LAB — URINALYSIS, ROUTINE W REFLEX MICROSCOPIC
Bilirubin Urine: NEGATIVE
Glucose, UA: NEGATIVE mg/dL
Hgb urine dipstick: NEGATIVE
Ketones, ur: NEGATIVE mg/dL
Protein, ur: NEGATIVE mg/dL

## 2012-04-11 MED ORDER — SODIUM CHLORIDE 0.9 % IV SOLN
INTRAVENOUS | Status: DC
  Administered 2012-04-11: 13:00:00 via INTRAVENOUS

## 2012-04-11 MED ORDER — SODIUM CHLORIDE 0.9 % IV BOLUS (SEPSIS)
1000.0000 mL | Freq: Once | INTRAVENOUS | Status: AC
  Administered 2012-04-11: 1000 mL via INTRAVENOUS

## 2012-04-11 MED ORDER — METOCLOPRAMIDE HCL 5 MG/ML IJ SOLN
10.0000 mg | Freq: Once | INTRAMUSCULAR | Status: AC
  Administered 2012-04-11: 10 mg via INTRAVENOUS
  Filled 2012-04-11 (×2): qty 2

## 2012-04-11 MED ORDER — DIPHENHYDRAMINE HCL 50 MG/ML IJ SOLN
25.0000 mg | Freq: Once | INTRAMUSCULAR | Status: AC
  Administered 2012-04-11: 25 mg via INTRAVENOUS
  Filled 2012-04-11: qty 1

## 2012-04-11 MED ORDER — CEPHALEXIN 500 MG PO CAPS: 500.0000 mg | ORAL_CAPSULE | Freq: Four times a day (QID) | ORAL | Status: DC

## 2012-04-11 NOTE — ED Notes (Signed)
Pt from home with reports of N/V that started about a week ago, headache that started 2 weeks ago and diarrhea that started this morning. Pt also endorses intermittent fever for about a week. Pt also reports intermittent, generalized abdominal pain as well as urine odor but denies vaginal discharge or abnormal bleeding. Pt reports being sexually active with last period 03/05/12.

## 2012-04-11 NOTE — ED Provider Notes (Signed)
History     CSN: [id number]  Arrival date & time 04/11/12  1012   First MD Initiated Contact with Patient 04/11/12 1040      Chief Complaint  Patient presents with  . Nausea  . Headache  . Emesis  . Diarrhea    (Consider location/radiation/quality/duration/timing/severity/associated sxs/prior treatment) Patient is a 26 y.o. female presenting with headaches, vomiting, and diarrhea. The history is provided by the patient.  Headache  Associated symptoms include vomiting.  Emesis  Associated symptoms include diarrhea and headaches.  Diarrhea The primary symptoms include vomiting and diarrhea.   patient here with multiple complaints including nonspecific headache x2 weeks that is dull in nature and constant and localized at the base of her neck. Some photophobia without fever or neck pain. Also notes upper epigastric pain with associated nausea but no vomiting or diarrhea. Does note some malodorous urine. Seen by her Dr. recently for similar symptoms had a negative pregnancy test. Similar symptoms one year ago and had a head CT which is negative. No treatment used prior to arrival.  Past Medical History  Diagnosis Date  . Tobacco use disorder   . History of recurrent urinary tract infection     Past Surgical History  Procedure Date  . Therapeutic abortion 08/2011    History reviewed. No pertinent family history.  History  Substance Use Topics  . Smoking status: Former Smoker -- 0.5 packs/day    Types: Cigarettes    Quit date: 09/15/2011  . Smokeless tobacco: Never Used  . Alcohol Use: 0.5 oz/week    1 drink(s) per week     Comment: 3 drinks per weekend.    OB History    Grav Para Term Preterm Abortions TAB SAB Ect Mult Living                  Review of Systems  Gastrointestinal: Positive for vomiting and diarrhea.  Neurological: Positive for headaches.  All other systems reviewed and are negative.    Allergies  Food allergy formula  Home Medications    Current Outpatient Rx  Name  Route  Sig  Dispense  Refill  . ACETAMINOPHEN 500 MG PO TABS   Oral   Take 500 mg by mouth every 6 (six) hours as needed. pain         . IBUPROFEN 200 MG PO TABS   Oral   Take 400 mg by mouth every 6 (six) hours as needed. pain           BP 115/66  Pulse 97  Temp 98.3 F (36.8 C) (Oral)  Resp 16  Wt 170 lb (77.111 kg)  SpO2 100%  LMP 03/05/2012  Physical Exam  Nursing note and vitals reviewed. Constitutional: She is oriented to person, place, and time. She appears well-developed and well-nourished.  Non-toxic appearance. No distress.  HENT:  Head: Normocephalic and atraumatic.  Eyes: Conjunctivae normal, EOM and lids are normal. Pupils are equal, round, and reactive to light.  Neck: Normal range of motion. Neck supple. No tracheal deviation present. No mass present.  Cardiovascular: Normal rate, regular rhythm and normal heart sounds.  Exam reveals no gallop.   No murmur heard. Pulmonary/Chest: Effort normal and breath sounds normal. No stridor. No respiratory distress. She has no decreased breath sounds. She has no wheezes. She has no rhonchi. She has no rales.  Abdominal: Soft. Normal appearance and bowel sounds are normal. She exhibits no distension. There is tenderness in the epigastric area. There is no  rigidity, no rebound, no guarding and no CVA tenderness.  Musculoskeletal: Normal range of motion. She exhibits no edema and no tenderness.  Neurological: She is alert and oriented to person, place, and time. She has normal strength. No cranial nerve deficit or sensory deficit. GCS eye subscore is 4. GCS verbal subscore is 5. GCS motor subscore is 6.  Skin: Skin is warm and dry. No abrasion and no rash noted.  Psychiatric: She has a normal mood and affect. Her speech is normal and behavior is normal.    ED Course  Procedures (including critical care time)   Labs Reviewed  URINALYSIS, ROUTINE W REFLEX MICROSCOPIC  CBC WITH  DIFFERENTIAL  COMPREHENSIVE METABOLIC PANEL  LIPASE, BLOOD   No results found.   No diagnosis found.    MDM  Pt given meds for headache and will tx pt for uti        Toy Baker, MD 04/11/12 1329

## 2012-04-13 LAB — URINE CULTURE: Colony Count: >=100000

## 2012-04-14 NOTE — ED Notes (Signed)
+  Urine. Patient treated with Keflex. Sensitive to same. Per protocol MD. °

## 2012-07-07 ENCOUNTER — Ambulatory Visit: Payer: Federal, State, Local not specified - PPO | Admitting: Family Medicine

## 2012-07-07 ENCOUNTER — Encounter: Payer: Self-pay | Admitting: Family Medicine

## 2012-07-07 ENCOUNTER — Ambulatory Visit (INDEPENDENT_AMBULATORY_CARE_PROVIDER_SITE_OTHER): Payer: Federal, State, Local not specified - PPO | Admitting: Family Medicine

## 2012-07-07 VITALS — BP 120/80 | HR 70 | Wt 172.0 lb

## 2012-07-07 DIAGNOSIS — L738 Other specified follicular disorders: Secondary | ICD-10-CM

## 2012-07-07 DIAGNOSIS — L301 Dyshidrosis [pompholyx]: Secondary | ICD-10-CM

## 2012-07-07 DIAGNOSIS — L739 Follicular disorder, unspecified: Secondary | ICD-10-CM

## 2012-07-07 MED ORDER — DOXYCYCLINE HYCLATE 100 MG PO TABS: 100.0000 mg | ORAL_TABLET | Freq: Two times a day (BID) | ORAL | Status: DC

## 2012-07-07 NOTE — Patient Instructions (Signed)
Use cortisone cream at the base year thumb twice per day until it's under control then back off to daily and then back off to every other day etc. to keep it out of control. Take all the antibiotic and if you not totally back to normal give me a call

## 2012-07-07 NOTE — Addendum Note (Signed)
Addended by: Shaune Spittle D on: 07/07/2012 01:42 PM   Modules accepted: Orders

## 2012-07-07 NOTE — Progress Notes (Signed)
  Subjective:    Patient ID: Dana Wells, female    DOB: July 29, 1985, 27 y.o.   MRN: [id number]  HPI She complains of difficulty with a rash after using Darene Lamer. He also has had difficulty with sebaceous cyst said infections under her breasts and in her pubic area. She also complains of a rash present in the hypo-thenar area of both hands.   Review of Systems     Objective:   Physical Exam Exam of her skin does show some dyshidrotic changes present in the hypo-thenar area of both hands. No lesions are noted on her face or elbows or knees. She does have follicular type rash in various stages of healing with occasional maculopapular type lesions. Evidence of recent difficulty with sebaceous cyst infection is noted in the pubic area as well as under both breasts.       Assessment & Plan:  Folliculitis - Plan: doxycycline (VIBRA-TABS) 100 MG tablet  Dyshidrotic eczema she is to use the doxycycline however if she has difficulty with the abscesses under her breasts or any pelvic area, she is to let me know. Also recommend using cortisone cream on the hands to get the dyshidrotic changes under better control and then the mother control with continued use at a lessor dosing regimen.

## 2012-07-25 ENCOUNTER — Ambulatory Visit (INDEPENDENT_AMBULATORY_CARE_PROVIDER_SITE_OTHER): Payer: Federal, State, Local not specified - PPO | Admitting: Family Medicine

## 2012-07-25 ENCOUNTER — Encounter: Payer: Self-pay | Admitting: Family Medicine

## 2012-07-25 ENCOUNTER — Telehealth: Payer: Self-pay | Admitting: Family Medicine

## 2012-07-25 VITALS — BP 114/70 | HR 96 | Ht 67.0 in | Wt 168.0 lb

## 2012-07-25 DIAGNOSIS — N390 Urinary tract infection, site not specified: Secondary | ICD-10-CM

## 2012-07-25 DIAGNOSIS — N912 Amenorrhea, unspecified: Secondary | ICD-10-CM

## 2012-07-25 DIAGNOSIS — L0231 Cutaneous abscess of buttock: Secondary | ICD-10-CM

## 2012-07-25 DIAGNOSIS — R300 Dysuria: Secondary | ICD-10-CM

## 2012-07-25 LAB — POCT URINALYSIS DIPSTICK
Bilirubin, UA: NEGATIVE
Blood, UA: 250
Glucose, UA: NEGATIVE
Ketones, UA: NEGATIVE
Protein, UA: POSITIVE
Spec Grav, UA: 1.015
pH, UA: 5

## 2012-07-25 LAB — POCT URINE PREGNANCY: Preg Test, Ur: NEGATIVE

## 2012-07-25 MED ORDER — TRAMADOL HCL 50 MG PO TABS: 50.0000 mg | ORAL_TABLET | Freq: Three times a day (TID) | ORAL | Status: DC | PRN

## 2012-07-25 MED ORDER — DOXYCYCLINE HYCLATE 100 MG PO TABS: 100.0000 mg | ORAL_TABLET | Freq: Two times a day (BID) | ORAL | Status: DC

## 2012-07-25 MED ORDER — SULFAMETHOXAZOLE-TRIMETHOPRIM 800-160 MG PO TABS: 1.0000 | ORAL_TABLET | Freq: Two times a day (BID) | ORAL | Status: DC

## 2012-07-25 MED ORDER — LIDOCAINE HCL 1 % IJ SOLN
2.0000 mL | Freq: Once | INTRAMUSCULAR | Status: AC
  Administered 2012-07-25: 2 mL via INTRADERMAL

## 2012-07-25 NOTE — Progress Notes (Signed)
  Subjective:    Patient ID: Dana Wells, female    DOB: 1986/02/02, 27 y.o.   MRN: [id number]  HPI She is here for complete examination. She is concerned about pregnancy and did have one positive urine pregnancy test. She did have slight spotting with her last cycle but it lasted a shorter period of time and had minimal cramping. She does not use birth control. She also is having dysuria and urgency this started today. She also has a previous history of folliculitis and recently finished a course of doxycycline and noted lesion starting right after that in the gluteal area. It is getting worse and causing difficulty with sitting. He has no other concerns or questions. Social history was reviewed. She does have a steady boyfriend for the last year.   Review of Systems Negative except as above    Objective:   Physical Exam alert and in no distress. Tympanic membranes and canals are normal. Throat is clear. Tonsils are normal. Neck is supple without adenopathy or thyromegaly. Cardiac exam shows a regular sinus rhythm without murmurs or gallops. Lungs are clear to auscultation. Exam of the gluteal area shows a swollen tender lesion in the medial aspect of the left gluteus it is approximately 2-3 cm in size and fluctuant       Assessment & Plan:  Burning with urination - Plan: POCT Urinalysis Dipstick, Urine culture, doxycycline (VIBRA-TABS) 100 MG tablet  Absence of menstruation - Plan: POCT urine pregnancy, hCG, serum, qualitative  Cellulitis and abscess of buttock - Plan: lidocaine (XYLOCAINE) 1 % (with pres) injection 2 mL, sulfamethoxazole-trimethoprim (BACTRIM DS,SEPTRA DS) 800-160 MG per tablet  UTI (urinary tract infection) an I+Dwas performed on the lesion after Xylocaine was injected. Periodic material was expressed. It was packed with iodoform. She is to return here in 2 days for packing removal. I will call in the pain medication for her I will also place her on doxycycline pending  pregnancy test.

## 2012-07-26 ENCOUNTER — Ambulatory Visit (INDEPENDENT_AMBULATORY_CARE_PROVIDER_SITE_OTHER): Payer: Federal, State, Local not specified - PPO | Admitting: Medical

## 2012-07-26 ENCOUNTER — Encounter: Payer: Self-pay | Admitting: Medical

## 2012-07-26 DIAGNOSIS — L0231 Cutaneous abscess of buttock: Secondary | ICD-10-CM

## 2012-07-26 LAB — HCG, SERUM, QUALITATIVE: Preg, Serum: NEGATIVE

## 2012-07-26 NOTE — Progress Notes (Signed)
Subjective: Here for recheck.   Saw Dr. Susann Givens yesterday for I&D of buttock, right cheek.   Started antibiotics yesterday, but the packing came out.   She does report drainage.  She is using warm salt water soaks.   She is concerned that the pain is there, she sits all day at work, and can't bear to sit all day with this pain.   Worried also about seepage through clothes.    Objective: Right buttock along gluteal cleft with 2-3cm round indurated area, slightly erythematous, tender, but no fluctuance, no purulent drainage, and there is a 8-16mm linear surgical wound that is open.  Exam chaperoned by nurse  Assessment: Encounter Diagnosis  Name Primary?  . Cellulitis and abscess of buttock Yes   Plan: C/t same antibiotics, warm compresses, note give for work, c/t Ultram for pain, recheck Friday.

## 2012-07-27 ENCOUNTER — Ambulatory Visit: Payer: Federal, State, Local not specified - PPO | Admitting: Family Medicine

## 2012-07-28 ENCOUNTER — Emergency Department (HOSPITAL_COMMUNITY)
Admission: EM | Admit: 2012-07-28 | Discharge: 2012-07-28 | Disposition: A | Discharge status: Home / Self Care | Payer: Federal, State, Local not specified - PPO | Attending: Emergency Medicine | Admitting: Emergency Medicine

## 2012-07-28 ENCOUNTER — Encounter (HOSPITAL_COMMUNITY): Payer: Self-pay | Admitting: Emergency Medicine

## 2012-07-28 ENCOUNTER — Ambulatory Visit: Payer: Federal, State, Local not specified - PPO | Admitting: Family Medicine

## 2012-07-28 ENCOUNTER — Emergency Department (HOSPITAL_COMMUNITY): Payer: Federal, State, Local not specified - PPO

## 2012-07-28 DIAGNOSIS — R109 Unspecified abdominal pain: Secondary | ICD-10-CM

## 2012-07-28 DIAGNOSIS — Z87891 Personal history of nicotine dependence: Secondary | ICD-10-CM | POA: Insufficient documentation

## 2012-07-28 DIAGNOSIS — Z3202 Encounter for pregnancy test, result negative: Secondary | ICD-10-CM | POA: Insufficient documentation

## 2012-07-28 DIAGNOSIS — N390 Urinary tract infection, site not specified: Secondary | ICD-10-CM | POA: Insufficient documentation

## 2012-07-28 DIAGNOSIS — R112 Nausea with vomiting, unspecified: Secondary | ICD-10-CM | POA: Insufficient documentation

## 2012-07-28 DIAGNOSIS — R1031 Right lower quadrant pain: Secondary | ICD-10-CM | POA: Insufficient documentation

## 2012-07-28 LAB — CBC WITH DIFFERENTIAL/PLATELET
Basophils Absolute: 0 10*3/uL (ref 0.0–0.1)
Basophils Relative: 0 % (ref 0–1)
Eosinophils Absolute: 0 10*3/uL (ref 0.0–0.7)
Eosinophils Relative: 0 % (ref 0–5)
HCT: 40.4 % (ref 36.0–46.0)
Lymphocytes Relative: 3 % — ABNORMAL LOW (ref 12–46)
MCH: 30.3 pg (ref 26.0–34.0)
MCHC: 34.7 g/dL (ref 30.0–36.0)
MCV: 87.4 fL (ref 78.0–100.0)
Monocytes Absolute: 0.2 10*3/uL (ref 0.1–1.0)
Platelets: 371 10*3/uL (ref 150–400)
RDW: 12.7 % (ref 11.5–15.5)
WBC: 12.1 10*3/uL — ABNORMAL HIGH (ref 4.0–10.5)

## 2012-07-28 LAB — COMPREHENSIVE METABOLIC PANEL
ALT: 12 U/L (ref 0–35)
AST: 21 U/L (ref 0–37)
CO2: 24 mEq/L (ref 19–32)
Calcium: 9.5 mg/dL (ref 8.4–10.5)
Creatinine, Ser: 0.79 mg/dL (ref 0.50–1.10)
GFR calc non Af Amer: >90 mL/min (ref >90)
Sodium: 133 mEq/L — ABNORMAL LOW (ref 135–145)
Total Protein: 9.2 g/dL — ABNORMAL HIGH (ref 6.0–8.3)

## 2012-07-28 LAB — POCT I-STAT TROPONIN I

## 2012-07-28 LAB — URINALYSIS, MICROSCOPIC ONLY
Glucose, UA: NEGATIVE mg/dL
Leukocytes, UA: NEGATIVE
Protein, ur: NEGATIVE mg/dL
Specific Gravity, Urine: 1.025 (ref 1.005–1.030)
Urobilinogen, UA: 1 mg/dL (ref 0.0–1.0)

## 2012-07-28 LAB — URINE CULTURE: Colony Count: >=100000

## 2012-07-28 MED ORDER — IOHEXOL 300 MG/ML  SOLN
50.0000 mL | Freq: Once | INTRAMUSCULAR | Status: AC | PRN
  Administered 2012-07-28: 50 mL via ORAL

## 2012-07-28 MED ORDER — ONDANSETRON HCL 4 MG/2ML IJ SOLN
4.0000 mg | Freq: Once | INTRAMUSCULAR | Status: AC
  Administered 2012-07-28: 4 mg via INTRAVENOUS
  Filled 2012-07-28: qty 2

## 2012-07-28 MED ORDER — IOHEXOL 300 MG/ML  SOLN
100.0000 mL | Freq: Once | INTRAMUSCULAR | Status: AC | PRN
  Administered 2012-07-28: 100 mL via INTRAVENOUS

## 2012-07-28 MED ORDER — SODIUM CHLORIDE 0.9 % IV SOLN
1000.0000 mL | Freq: Once | INTRAVENOUS | Status: AC
  Administered 2012-07-28: 1000 mL via INTRAVENOUS

## 2012-07-28 NOTE — ED Provider Notes (Signed)
  Physical Exam  BP 118/68  Pulse 92  Temp(Src) 98.2 F (36.8 C) (Oral)  Resp 19  SpO2 100%  LMP 07/16/2012  Physical Exam CT Scan reviewed  Negative for appy  States has not taken anything for pain since yesterday  ED Course  Procedures  MDM Will sent home encourage regular use of Tramadol for paion control      Arman Filter, NP 07/28/12 2113

## 2012-07-28 NOTE — ED Provider Notes (Signed)
Medical screening examination/treatment/procedure(s) were performed by non-physician practitioner and as supervising physician I was immediately available for consultation/collaboration.   Danesha Kirchoff, MD 07/28/12 2301 

## 2012-07-28 NOTE — ED Notes (Signed)
Nurse trying to obtain labs with IV start

## 2012-07-28 NOTE — ED Notes (Signed)
EKG given to [facility] at the time noted in the chart.

## 2012-07-28 NOTE — ED Notes (Signed)
Pt verbalizes understanding 

## 2012-07-28 NOTE — ED Provider Notes (Signed)
Medical screening examination/treatment/procedure(s) were performed by non-physician practitioner and as supervising physician I was immediately available for consultation/collaboration.   David Yelverton, MD 07/28/12 2303 

## 2012-07-28 NOTE — Telephone Encounter (Signed)
DONE

## 2012-07-28 NOTE — ED Notes (Signed)
Pt presenting to ed with c/o abdominal pain with positive nausea, vomiting and diarrhea pt also with c/o chest pain onset last night. Pt states she is currently on antibiotics for a cyst

## 2012-07-28 NOTE — ED Provider Notes (Signed)
History     CSN: [id number]  Arrival date & time 07/28/12  1755   First MD Initiated Contact with Patient 07/28/12 1818      Chief Complaint  Patient presents with  . Abdominal Pain  . nausea and vomiting    (Consider location/radiation/quality/duration/timing/severity/associated sxs/prior treatment) HPI  She presents to the emergency department with complaints of right lower quadrant abdominal pain that radiates to her. Umbilical region. The pain started acutely last night and has continued to worsen. She denies having any fever but has been vomiting at home. She is currently being treated for a UTI and a cyst. She feels that she may be pregnant but is unsure. Pt has not had any episodes of vomiting in the ED. nad vss at this time.  Past Medical History  Diagnosis Date  . Tobacco use disorder   . History of recurrent urinary tract infection     Past Surgical History  Procedure Laterality Date  . Therapeutic abortion  08/2011    No family history on file.  History  Substance Use Topics  . Smoking status: Former Smoker -- 0.50 packs/day    Types: Cigarettes    Quit date: 09/15/2011  . Smokeless tobacco: Never Used  . Alcohol Use: 0.5 oz/week    1 drink(s) per week     Comment: 3 drinks per weekend.    OB History   Grav Para Term Preterm Abortions TAB SAB Ect Mult Living                  Review of Systems  All other systems reviewed and are negative.    Allergies  Food allergy formula  Home Medications   Current Outpatient Rx  Name  Route  Sig  Dispense  Refill  . doxycycline (VIBRA-TABS) 100 MG tablet   Oral   Take 1 tablet (100 mg total) by mouth 2 (two) times daily.   28 tablet   0   . ibuprofen (ADVIL,MOTRIN) 200 MG tablet   Oral   Take 400 mg by mouth every 6 (six) hours as needed. pain         . sulfamethoxazole-trimethoprim (BACTRIM DS,SEPTRA DS) 800-160 MG per tablet   Oral   Take 1 tablet by mouth 2 (two) times daily.   28 tablet    0   . traMADol (ULTRAM) 50 MG tablet   Oral   Take 1 tablet (50 mg total) by mouth every 8 (eight) hours as needed for pain.   30 tablet   0     BP 118/68  Pulse 92  Temp(Src) 98.2 F (36.8 C) (Oral)  Resp 19  SpO2 100%  LMP 07/16/2012  Physical Exam  Nursing note and vitals reviewed. Constitutional: She appears well-developed and well-nourished. No distress.  HENT:  Head: Normocephalic and atraumatic.  Eyes: Pupils are equal, round, and reactive to light.  Neck: Normal range of motion. Neck supple.  Cardiovascular: Normal rate and regular rhythm.   Pulmonary/Chest: Effort normal.  Abdominal: Soft. There is tenderness in the right lower quadrant and periumbilical area. There is no rebound, no guarding, no CVA tenderness, no tenderness at McBurney's point and negative Murphy's sign.    Neurological: She is alert.  Skin: Skin is warm and dry.    ED Course  Procedures (including critical care time)  Labs Reviewed  CBC WITH DIFFERENTIAL  COMPREHENSIVE METABOLIC PANEL  LIPASE, BLOOD  URINALYSIS, MICROSCOPIC ONLY   No results found.   No diagnosis found.  MDM   Date: 07/28/2012  Rate: 90  Rhythm: sinus rhythm  QRS Axis: normal  Intervals: normal  ST/T Wave abnormalities: normal  Conduction Disutrbances:none  Narrative Interpretation:   Old EKG Reviewed: none available   Pt is having RLQ need to r/o appendicitis. Will wait for hcg to r/o pregnancy and then order CT abd/pelv to r/o appy.    End of shift patient hand-off to Sabino Dick, NP to follow-up of abd CT.   Dorthula Matas, PA-C 07/28/12 2017

## 2012-09-12 ENCOUNTER — Ambulatory Visit: Payer: Self-pay | Admitting: Medical

## 2012-09-12 ENCOUNTER — Ambulatory Visit: Payer: Self-pay | Admitting: Family Medicine

## 2012-11-07 ENCOUNTER — Ambulatory Visit (INDEPENDENT_AMBULATORY_CARE_PROVIDER_SITE_OTHER): Payer: Federal, State, Local not specified - PPO | Admitting: Family Medicine

## 2012-11-07 ENCOUNTER — Encounter: Payer: Self-pay | Admitting: Family Medicine

## 2012-11-07 VITALS — BP 120/80 | HR 66

## 2012-11-07 DIAGNOSIS — T700XXA Otitic barotrauma, initial encounter: Secondary | ICD-10-CM

## 2012-11-07 DIAGNOSIS — M79609 Pain in unspecified limb: Secondary | ICD-10-CM

## 2012-11-07 DIAGNOSIS — M79602 Pain in left arm: Secondary | ICD-10-CM

## 2012-11-07 DIAGNOSIS — S0512XA Contusion of eyeball and orbital tissues, left eye, initial encounter: Secondary | ICD-10-CM

## 2012-11-07 DIAGNOSIS — R6884 Jaw pain: Secondary | ICD-10-CM

## 2012-11-07 DIAGNOSIS — H53149 Visual discomfort, unspecified: Secondary | ICD-10-CM
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
## 2012-11-07 NOTE — Patient Instructions (Addendum)
Take 4 ibuprofen 3 times a day as needed for pain You will probably do better with softer foods

## 2012-11-08 NOTE — Addendum Note (Signed)
Addended by: Laureen Ochs F on: 11/08/2012 08:32 AM   Modules accepted: Orders

## 2012-11-09 ENCOUNTER — Inpatient Hospital Stay: Admission: RE | Admit: 2012-11-09 | Payer: Federal, State, Local not specified - PPO | Source: Ambulatory Visit

## 2012-11-21 ENCOUNTER — Ambulatory Visit: Payer: Federal, State, Local not specified - PPO | Admitting: Family Medicine

## 2012-11-27 ENCOUNTER — Encounter: Payer: Self-pay | Admitting: Family Medicine

## 2013-01-18 ENCOUNTER — Encounter (HOSPITAL_COMMUNITY): Payer: Self-pay | Admitting: Emergency Medicine

## 2013-01-18 ENCOUNTER — Emergency Department (HOSPITAL_COMMUNITY): Payer: Federal, State, Local not specified - PPO

## 2013-01-18 ENCOUNTER — Emergency Department (HOSPITAL_COMMUNITY)
Admission: EM | Admit: 2013-01-18 | Discharge: 2013-01-18 | Disposition: A | Discharge status: Home / Self Care | Payer: Federal, State, Local not specified - PPO | Attending: Emergency Medicine | Admitting: Emergency Medicine

## 2013-01-18 DIAGNOSIS — S6990XA Unspecified injury of unspecified wrist, hand and finger(s), initial encounter: Secondary | ICD-10-CM | POA: Insufficient documentation

## 2013-01-18 DIAGNOSIS — Z87440 Personal history of urinary (tract) infections: Secondary | ICD-10-CM | POA: Insufficient documentation

## 2013-01-18 DIAGNOSIS — Z87891 Personal history of nicotine dependence: Secondary | ICD-10-CM | POA: Insufficient documentation

## 2013-01-18 DIAGNOSIS — Z3202 Encounter for pregnancy test, result negative: Secondary | ICD-10-CM | POA: Insufficient documentation

## 2013-01-18 DIAGNOSIS — S6992XA Unspecified injury of left wrist, hand and finger(s), initial encounter: Secondary | ICD-10-CM

## 2013-01-18 MED ORDER — HYDROCODONE-ACETAMINOPHEN 5-325 MG PO TABS
1.0000 | ORAL_TABLET | Freq: Once | ORAL | Status: AC
  Administered 2013-01-18: 1 via ORAL
  Filled 2013-01-18: qty 1

## 2013-01-18 MED ORDER — METHOCARBAMOL 500 MG PO TABS: 500.0000 mg | ORAL_TABLET | Freq: Two times a day (BID) | ORAL | Status: DC

## 2013-01-18 MED ORDER — HYDROCODONE-ACETAMINOPHEN 5-325 MG PO TABS: 2.0000 | ORAL_TABLET | ORAL | Status: DC | PRN

## 2013-01-18 NOTE — ED Provider Notes (Signed)
Medical screening examination/treatment/procedure(s) were performed by non-physician practitioner and as supervising physician I was immediately available for consultation/collaboration.   Dagmar Hait, MD 01/18/13 1409
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
## 2013-01-18 NOTE — ED Notes (Signed)
Pt undressed in gown and given warm blanket

## 2013-01-18 NOTE — ED Notes (Signed)
Patient transported to X-ray 

## 2013-02-03 ENCOUNTER — Encounter (HOSPITAL_COMMUNITY): Payer: Self-pay | Admitting: Emergency Medicine

## 2013-02-03 ENCOUNTER — Emergency Department (HOSPITAL_COMMUNITY)
Admission: EM | Admit: 2013-02-03 | Discharge: 2013-02-03 | Disposition: A | Discharge status: Home Health | Payer: Federal, State, Local not specified - PPO | Attending: Emergency Medicine | Admitting: Emergency Medicine

## 2013-02-03 DIAGNOSIS — Z87440 Personal history of urinary (tract) infections: Secondary | ICD-10-CM | POA: Insufficient documentation

## 2013-02-03 DIAGNOSIS — N912 Amenorrhea, unspecified: Secondary | ICD-10-CM | POA: Insufficient documentation

## 2013-02-03 DIAGNOSIS — R5381 Other malaise: Secondary | ICD-10-CM | POA: Insufficient documentation

## 2013-02-03 DIAGNOSIS — Z87891 Personal history of nicotine dependence: Secondary | ICD-10-CM | POA: Insufficient documentation

## 2013-02-03 DIAGNOSIS — Z3201 Encounter for pregnancy test, result positive: Secondary | ICD-10-CM | POA: Insufficient documentation

## 2013-02-03 LAB — URINALYSIS, ROUTINE W REFLEX MICROSCOPIC
Glucose, UA: NEGATIVE mg/dL
Ketones, ur: 15 mg/dL — AB
Leukocytes, UA: NEGATIVE
Protein, ur: NEGATIVE mg/dL

## 2013-02-03 NOTE — ED Provider Notes (Signed)
CSN: [id number]     Arrival date & time 02/03/13  1930 History  This chart was scribed for non-physician practitioner Earley Favor working with Gilda Crease, * by Carl Best, ED Scribe. This patient was seen in room WTR5/WTR5 and the patient's care was started at 8:50 PM.     Chief Complaint  Patient presents with  . Possible Pregnancy    Patient is a 27 y.o. female presenting with pregnancy problem. The history is provided by the patient. No language interpreter was used.  Possible Pregnancy Patient reports no vaginal discharge.  Associated symptoms include no dysuria.   HPI Comments: Dana Wells is a 27 y.o. female who presents to the Emergency Department complaining of a possible pregnancy.  Patient states that her last period was December 16, 2012.  She states that she has been sexually active and having unprotected sex.  Patient denies using birth control.   Patient denies vaginal discharge, dysuria or a concern for vaginal infection as associated symptoms.  Patient states that she is cramping and very tired.    Past Medical History  Diagnosis Date  . Tobacco use disorder   . History of recurrent urinary tract infection    Past Surgical History  Procedure Laterality Date  . Therapeutic abortion  08/2011   History reviewed. No pertinent family history. History  Substance Use Topics  . Smoking status: Former Smoker -- 0.50 packs/day    Types: Cigarettes    Quit date: 09/15/2011  . Smokeless tobacco: Never Used  . Alcohol Use: 0.5 oz/week    1 drink(s) per week     Comment: 3 drinks per weekend.   OB History   Grav Para Term Preterm Abortions TAB SAB Ect Mult Living                 Review of Systems  Constitutional: Positive for fatigue.  Genitourinary: Positive for menstrual problem (menstrual abnormality). Negative for dysuria and vaginal discharge.    Allergies  Food allergy formula  Home Medications   Current Outpatient Rx  Name  Route  Sig  Dispense   Refill  . HYDROcodone-acetaminophen (NORCO/VICODIN) 5-325 MG per tablet   Oral   Take 2 tablets by mouth every 4 (four) hours as needed for pain.   10 tablet   0   . methocarbamol (ROBAXIN) 500 MG tablet   Oral   Take 1 tablet (500 mg total) by mouth 2 (two) times daily.   20 tablet   0   . ibuprofen (ADVIL,MOTRIN) 200 MG tablet   Oral   Take 400 mg by mouth every 6 (six) hours as needed. pain          Triage Vitals: BP 118/62  Pulse 69  Temp(Src) 98.3 F (36.8 C) (Oral)  Resp 15  Ht 5\' 7"  (1.702 m)  Wt 168 lb (76.204 kg)  BMI 26.31 kg/m2  SpO2 100%  LMP 12/19/2012  Physical Exam  Constitutional: She is oriented to person, place, and time. She appears well-developed and well-nourished. No distress.  HENT:  Head: Normocephalic.  Eyes: Pupils are equal, round, and reactive to light.  Neck: Normal range of motion.  Cardiovascular: Normal rate and regular rhythm.   Pulmonary/Chest: Effort normal.  Musculoskeletal: Normal range of motion.  Neurological: She is alert and oriented to person, place, and time.  Skin: Skin is warm and dry.  Psychiatric: She has a normal mood and affect. Her behavior is normal.    ED Course  Procedures (  including critical care time)  DIAGNOSTIC STUDIES: Oxygen Saturation is 100% on room air, normal by my interpretation.    COORDINATION OF CARE: 8:52 PM- Advised the patient to follow up with her PCP if she does not get her period within the next few weeks and discharged the patient.     Labs Review Labs Reviewed  URINALYSIS, ROUTINE W REFLEX MICROSCOPIC - Abnormal; Notable for the following:    APPearance CLOUDY (*)    Bilirubin Urine SMALL (*)    Ketones, ur 15 (*)    All other components within normal limits  POCT PREGNANCY, URINE   Imaging Review No results found.  MDM   1. Amenorrhea     Urine pregnancy test is negative.  Urine is negative for of infection.  Patient is quite disappointed that she is not pregnant issues  and try actively to become pregnant.  After a miscarriage, suffered approximately one year, ago.  She's only followed by primary care physician.  I suggested she perhaps see an OB/GYN  I personally performed the services described in this documentation, which was scribed in my presence. The recorded information has been reviewed and is accurate.  Arman Filter, NP 02/03/13 2104

## 2013-02-03 NOTE — ED Notes (Signed)
Pt not in room to receive d/c instructions.  

## 2013-02-03 NOTE — ED Notes (Signed)
Pt ambulatory to exam room with steady gait.  

## 2013-02-03 NOTE — ED Provider Notes (Signed)
Medical screening examination/treatment/procedure(s) were performed by non-physician practitioner and as supervising physician I was immediately available for consultation/collaboration.    Gilda Crease, MD 02/03/13 2107

## 2013-02-03 NOTE — Discharge Instructions (Signed)
You've missed one menstrual cycle in approximately 2 weeks late, your home.  Pregnancy test, and urine, emergency Department pregnancy test are both negative.  Please followup with your primary care physician for further evaluation.  As needed

## 2013-02-03 NOTE — ED Notes (Signed)
Pt reports last period 12/16/2012, this month only spotting, abdominal cramping and lethargy, no vaginal discharge or other GU complaints.  Pt reports to having unprotected sex, states she took an at home pregnancy test x2 weeks ago that was negative.

## 2013-03-17 ENCOUNTER — Encounter (HOSPITAL_COMMUNITY): Payer: Self-pay | Admitting: Emergency Medicine

## 2013-03-17 ENCOUNTER — Emergency Department (HOSPITAL_COMMUNITY)
Admission: EM | Admit: 2013-03-17 | Discharge: 2013-03-17 | Disposition: A | Discharge status: Home / Self Care | Payer: Self-pay | Attending: Emergency Medicine | Admitting: Emergency Medicine

## 2013-03-17 ENCOUNTER — Telehealth (HOSPITAL_COMMUNITY): Payer: Self-pay | Admitting: *Deleted

## 2013-03-17 DIAGNOSIS — R112 Nausea with vomiting, unspecified: Secondary | ICD-10-CM

## 2013-03-17 DIAGNOSIS — R42 Dizziness and giddiness: Secondary | ICD-10-CM | POA: Insufficient documentation

## 2013-03-17 DIAGNOSIS — R197 Diarrhea, unspecified: Secondary | ICD-10-CM | POA: Insufficient documentation

## 2013-03-17 DIAGNOSIS — R1084 Generalized abdominal pain: Secondary | ICD-10-CM | POA: Insufficient documentation

## 2013-03-17 DIAGNOSIS — Z3202 Encounter for pregnancy test, result negative: Secondary | ICD-10-CM | POA: Insufficient documentation

## 2013-03-17 DIAGNOSIS — Z87891 Personal history of nicotine dependence: Secondary | ICD-10-CM | POA: Insufficient documentation

## 2013-03-17 DIAGNOSIS — N390 Urinary tract infection, site not specified: Secondary | ICD-10-CM

## 2013-03-17 DIAGNOSIS — Z792 Long term (current) use of antibiotics: Secondary | ICD-10-CM | POA: Insufficient documentation

## 2013-03-17 DIAGNOSIS — R51 Headache: Secondary | ICD-10-CM | POA: Insufficient documentation

## 2013-03-17 DIAGNOSIS — R519 Headache, unspecified: Secondary | ICD-10-CM

## 2013-03-17 LAB — URINE MICROSCOPIC-ADD ON

## 2013-03-17 LAB — BASIC METABOLIC PANEL
BUN: 11 mg/dL (ref 6–23)
Calcium: 9.5 mg/dL (ref 8.4–10.5)
GFR calc non Af Amer: >90 mL/min (ref >90)
Glucose, Bld: 99 mg/dL (ref 70–99)

## 2013-03-17 LAB — CBC WITH DIFFERENTIAL/PLATELET
Eosinophils Absolute: 0 10*3/uL (ref 0.0–0.7)
Eosinophils Relative: 0 % (ref 0–5)
Hemoglobin: 13.1 g/dL (ref 12.0–15.0)
Lymphs Abs: 2.2 10*3/uL (ref 0.7–4.0)
MCH: 30.6 pg (ref 26.0–34.0)
MCV: 89.5 fL (ref 78.0–100.0)
Monocytes Relative: 7 % (ref 3–12)
RBC: 4.28 MIL/uL (ref 3.87–5.11)

## 2013-03-17 LAB — URINALYSIS, ROUTINE W REFLEX MICROSCOPIC
Bilirubin Urine: NEGATIVE
Specific Gravity, Urine: 1.029 (ref 1.005–1.030)
Urobilinogen, UA: 1 mg/dL (ref 0.0–1.0)
pH: 6 (ref 5.0–8.0)

## 2013-03-17 MED ORDER — SODIUM CHLORIDE 0.9 % IV BOLUS (SEPSIS)
1000.0000 mL | Freq: Once | INTRAVENOUS | Status: AC
  Administered 2013-03-17: 1000 mL via INTRAVENOUS

## 2013-03-17 MED ORDER — DIPHENHYDRAMINE HCL 50 MG/ML IJ SOLN: 25.0000 mg | Freq: Once | INTRAMUSCULAR | Status: DC

## 2013-03-17 MED ORDER — DEXTROSE 5 % IV SOLN
1.0000 g | Freq: Once | INTRAVENOUS | Status: AC
  Administered 2013-03-17: 1 g via INTRAVENOUS
  Filled 2013-03-17: qty 10

## 2013-03-17 MED ORDER — PROMETHAZINE HCL 25 MG PO TABS: 25.0000 mg | ORAL_TABLET | Freq: Four times a day (QID) | ORAL | Status: DC | PRN

## 2013-03-17 MED ORDER — MORPHINE SULFATE 4 MG/ML IJ SOLN
4.0000 mg | Freq: Once | INTRAMUSCULAR | Status: AC
  Administered 2013-03-17: 4 mg via INTRAVENOUS
  Filled 2013-03-17: qty 1

## 2013-03-17 MED ORDER — ACETAMINOPHEN 325 MG PO TABS
650.0000 mg | ORAL_TABLET | Freq: Once | ORAL | Status: AC
  Administered 2013-03-17: 650 mg via ORAL
  Filled 2013-03-17: qty 2

## 2013-03-17 MED ORDER — HYDROCODONE-ACETAMINOPHEN 5-325 MG PO TABS: ORAL_TABLET | ORAL | Status: DC

## 2013-03-17 MED ORDER — PROCHLORPERAZINE EDISYLATE 5 MG/ML IJ SOLN: 10.0000 mg | Freq: Once | INTRAMUSCULAR | Status: DC

## 2013-03-17 MED ORDER — LORAZEPAM 2 MG/ML IJ SOLN
1.0000 mg | Freq: Once | INTRAMUSCULAR | Status: AC
  Administered 2013-03-17: 1 mg via INTRAVENOUS
  Filled 2013-03-17: qty 1

## 2013-03-17 MED ORDER — ONDANSETRON HCL 4 MG/2ML IJ SOLN
4.0000 mg | Freq: Once | INTRAMUSCULAR | Status: AC
  Administered 2013-03-17: 4 mg via INTRAVENOUS
  Filled 2013-03-17: qty 2

## 2013-03-17 MED ORDER — CEPHALEXIN 500 MG PO CAPS: 500.0000 mg | ORAL_CAPSULE | Freq: Two times a day (BID) | ORAL | Status: DC

## 2013-03-17 MED ORDER — ONDANSETRON 4 MG PO TBDP
4.0000 mg | ORAL_TABLET | Freq: Once | ORAL | Status: AC
  Administered 2013-03-17: 4 mg via ORAL
  Filled 2013-03-17: qty 1

## 2013-03-17 MED ORDER — ONDANSETRON HCL 4 MG/2ML IJ SOLN
4.0000 mg | Freq: Once | INTRAMUSCULAR | Status: DC
  Filled 2013-03-17: qty 2

## 2013-03-17 MED ORDER — PROMETHAZINE HCL 25 MG/ML IJ SOLN
12.5000 mg | Freq: Once | INTRAMUSCULAR | Status: AC
  Administered 2013-03-17: 12.5 mg via INTRAVENOUS
  Filled 2013-03-17: qty 1

## 2013-03-17 MED ORDER — MECLIZINE HCL 25 MG PO TABS
50.0000 mg | ORAL_TABLET | Freq: Once | ORAL | Status: AC
  Administered 2013-03-17: 50 mg via ORAL
  Filled 2013-03-17: qty 2

## 2013-03-17 NOTE — ED Provider Notes (Signed)
Medical screening examination/treatment/procedure(s) were conducted as a shared visit with non-physician practitioner(s) or resident  and myself.  I personally evaluated the patient during the encounter and agree with the findings and plan unless otherwise indicated.    I have personally reviewed any xrays and/ or EKG's with the provider and I agree with interpretation.   Non focal abd pain and vomiting.  No flank pain.  Exam no flank pain, abd soft, mild suprapubic tenderness, dry mm. Fluids and pain meds in ED.  With recurrent vomiting/ UTI rocephin given. Pt tolerated po. Close fup outpt with po abx.   UTI, vomiting, dehydration    Enid Skeens, MD 03/17/13 1758

## 2013-03-17 NOTE — ED Provider Notes (Signed)
CSN: [id number]     Arrival date & time 03/17/13  0547 History   First MD Initiated Contact with Patient 03/17/13 [phone]     Chief Complaint  Patient presents with  . Emesis   (Consider location/radiation/quality/duration/timing/severity/associated sxs/prior Treatment) HPI  Dana Wells is a 27 y.o. female with no significant past medical history complaining of acute onset of nausea vomiting diarrhea diffuse abdominal pain, headache and vertigo at 1600 yesterday. States vertigo is exacerbated by head movement, but is generally constant. Patient has had multiple episodes of nonbloody, nonbilious, non-coffee ground emesis. Patient denies sick contacts, hematochezia, melena, fever, dysuria, frequency, abnormal vaginal discharge.   Past Medical History  Diagnosis Date  . Tobacco use disorder   . History of recurrent urinary tract infection    Past Surgical History  Procedure Laterality Date  . Therapeutic abortion  08/2011   No family history on file. History  Substance Use Topics  . Smoking status: Former Smoker -- 0.50 packs/day    Types: Cigarettes    Quit date: 09/15/2011  . Smokeless tobacco: Never Used  . Alcohol Use: 0.5 oz/week    1 drink(s) per week     Comment: 3 drinks per weekend.   OB History   Grav Para Term Preterm Abortions TAB SAB Ect Mult Living                 Review of Systems 10 systems reviewed and found to be negative, except as noted in the HPI   Allergies  Food allergy formula  Home Medications   Current Outpatient Rx  Name  Route  Sig  Dispense  Refill  . cephALEXin (KEFLEX) 500 MG capsule   Oral   Take 1 capsule (500 mg total) by mouth 2 (two) times daily.   20 capsule   0   . HYDROcodone-acetaminophen (NORCO/VICODIN) 5-325 MG per tablet      Take 1-2 tablets by mouth every 6 hours as needed for pain.   15 tablet   0   . promethazine (PHENERGAN) 25 MG tablet   Oral   Take 1 tablet (25 mg total) by mouth every 6 (six) hours as needed for  nausea.   12 tablet   0    BP 94/51  Pulse 60  Temp(Src) 98.7 F (37.1 C) (Oral)  SpO2 100%  LMP 03/10/2013 Physical Exam  Nursing note and vitals reviewed. Constitutional: She is oriented to person, place, and time. She appears well-developed and well-nourished. No distress.  HENT:  Head: Normocephalic.  Mouth/Throat: Oropharynx is clear and moist.  Eyes: Conjunctivae and EOM are normal. Pupils are equal, round, and reactive to light.  Cardiovascular: Normal rate, regular rhythm and intact distal pulses.   Pulmonary/Chest: Effort normal and breath sounds normal. No stridor. No respiratory distress. She has no wheezes. She has no rales. She exhibits no tenderness.  Abdominal: Soft. Bowel sounds are normal. She exhibits no distension and no mass. There is tenderness. There is no rebound and no guarding.  Sleep tender to palpation with no guarding or rebound. Patient has no specific tenderness over McBurney point, resting, psoas and obturator are negative.  Musculoskeletal: Normal range of motion. She exhibits no edema and no tenderness.  Neurological: She is alert and oriented to person, place, and time.  Dix-Hallpike is negative bilaterally  Follows commands, Goal oriented speech, Strength is 5 out of 5x4 extremities, patient ambulates with a coordinated in nonantalgic gait. Sensation is grossly intact.  Psychiatric: She has a normal  mood and affect.    ED Course  Procedures (including critical care time) Labs Review Labs Reviewed  URINALYSIS, ROUTINE W REFLEX MICROSCOPIC - Abnormal; Notable for the following:    Color, Urine AMBER (*)    APPearance TURBID (*)    Hgb urine dipstick SMALL (*)    Protein, ur 30 (*)    Nitrite POSITIVE (*)    Leukocytes, UA SMALL (*)    All other components within normal limits  CBC WITH DIFFERENTIAL - Abnormal; Notable for the following:    WBC 10.7 (*)    All other components within normal limits  URINE MICROSCOPIC-ADD ON - Abnormal;  Notable for the following:    Squamous Epithelial / LPF MANY (*)    Bacteria, UA MANY (*)    All other components within normal limits  URINE CULTURE  BASIC METABOLIC PANEL  POCT PREGNANCY, URINE   Imaging Review No results found.  EKG Interpretation   None      10:40 AM she is tolerating by mouth, states that she has headache with vertigo, meclizine given 30 minutes ago. Patient rates her headache as 6/10 I will try Compazine and Reglan, another bolus  MDM   1. UTI (lower urinary tract infection)   2. Nausea vomiting and diarrhea   3. Headache     Filed Vitals:   03/17/13 0745 03/17/13 0800  BP: 102/67 94/51  Pulse: 70 60  Temp:  98.7 F (37.1 C)  TempSrc:  Oral  SpO2: 100% 100%     Dana Wells is a 27 y.o. female with nausea vomiting diarrhea, headache and vertigo patient also thinks that she may be pregnant. Abdominal exam is nonsurgical, neuro exam shows no focal deficits. Blood work is grossly normal, urinalysis consistent with infection. Patient has no CVA tenderness I doubt pyelonephritis. Patient will be given a gram of Rocephin IV and sent home with Keflex. Review of prior urine culture shows that her Escherichia coli strain is sensitive to similar abx. As a shared visit with attending physician Dr. Jodi Mourning was personally evaluated the patient and agrees with care plan and stability to discharge to home.  Medications  sodium chloride 0.9 % bolus 1,000 mL (0 mLs Intravenous Stopped 03/17/13 0822)  ondansetron (ZOFRAN-ODT) disintegrating tablet 4 mg (4 mg Oral Given 03/17/13 0630)  LORazepam (ATIVAN) injection 1 mg (1 mg Intravenous Given 03/17/13 0725)  acetaminophen (TYLENOL) tablet 650 mg (650 mg Oral Given 03/17/13 0725)  ondansetron (ZOFRAN) injection 4 mg (4 mg Intravenous Given 03/17/13 0725)  cefTRIAXone (ROCEPHIN) 1 g in dextrose 5 % 50 mL IVPB (0 g Intravenous Stopped 03/17/13 0912)  morphine 4 MG/ML injection 4 mg (4 mg Intravenous Given 03/17/13 0856)   promethazine (PHENERGAN) injection 12.5 mg (12.5 mg Intravenous Given 03/17/13 0856)  sodium chloride 0.9 % bolus 1,000 mL (0 mLs Intravenous Stopped 03/17/13 1012)  meclizine (ANTIVERT) tablet 50 mg (50 mg Oral Given 03/17/13 0928)    Pt is hemodynamically stable, appropriate for, and amenable to discharge at this time. Pt verbalized understanding and agrees with care plan. All questions answered. Outpatient follow-up and specific return precautions discussed.    New Prescriptions   CEPHALEXIN (KEFLEX) 500 MG CAPSULE    Take 1 capsule (500 mg total) by mouth 2 (two) times daily.   HYDROCODONE-ACETAMINOPHEN (NORCO/VICODIN) 5-325 MG PER TABLET    Take 1-2 tablets by mouth every 6 hours as needed for pain.   PROMETHAZINE (PHENERGAN) 25 MG TABLET    Take 1 tablet (  25 mg total) by mouth every 6 (six) hours as needed for nausea.    Note: Portions of this report may have been transcribed using voice recognition software. Every effort was made to ensure accuracy; however, inadvertent computerized transcription errors may be present      Wynetta Emery, PA-C 03/17/13 1043

## 2013-03-17 NOTE — ED Notes (Addendum)
Pt. Reports vomiting since 1600 yesterday. States she's been feeling dizzy with HA and fell at work. Still nauseous but has not vomited since 0400 and is tolerating PO fluids. Also reports unprotected sex and possibility of being pregnant.

## 2013-03-17 NOTE — ED Notes (Signed)
Pt. Refusing IV at this time. PA notified

## 2013-03-17 NOTE — ED Notes (Signed)
Pt alert and oriented, with steady gait at time of discharge. Pt given discharge papers and papers explained. All questions answered and pt walked to discharge.  

## 2013-03-17 NOTE — ED Notes (Signed)
Pt requesting work note in person

## 2013-03-17 NOTE — ED Notes (Signed)
Pt refused IV access.

## 2013-03-19 LAB — URINE CULTURE: Colony Count: >=100000

## 2013-03-20 NOTE — ED Notes (Signed)
+   urine Treatment ok per Kaitlyn  Szekalski  

## 2013-04-16 ENCOUNTER — Ambulatory Visit: Payer: Federal, State, Local not specified - PPO | Admitting: Family Medicine

## 2013-04-18 ENCOUNTER — Encounter: Payer: Self-pay | Admitting: Family Medicine

## 2013-09-22 ENCOUNTER — Emergency Department (HOSPITAL_COMMUNITY)
Admission: EM | Admit: 2013-09-22 | Discharge: 2013-09-22 | Disposition: A | Discharge status: Home / Self Care | Payer: Self-pay | Attending: Emergency Medicine | Admitting: Emergency Medicine

## 2013-09-22 ENCOUNTER — Emergency Department (HOSPITAL_COMMUNITY): Payer: Self-pay

## 2013-09-22 ENCOUNTER — Encounter (HOSPITAL_COMMUNITY): Payer: Self-pay | Admitting: Emergency Medicine

## 2013-09-22 DIAGNOSIS — S01511A Laceration without foreign body of lip, initial encounter: Secondary | ICD-10-CM

## 2013-09-22 DIAGNOSIS — K002 Abnormalities of size and form of teeth: Secondary | ICD-10-CM | POA: Insufficient documentation

## 2013-09-22 DIAGNOSIS — Z87891 Personal history of nicotine dependence: Secondary | ICD-10-CM | POA: Insufficient documentation

## 2013-09-22 DIAGNOSIS — S025XXA Fracture of tooth (traumatic), initial encounter for closed fracture: Secondary | ICD-10-CM | POA: Insufficient documentation

## 2013-09-22 DIAGNOSIS — S0993XA Unspecified injury of face, initial encounter: Secondary | ICD-10-CM

## 2013-09-22 DIAGNOSIS — Z87440 Personal history of urinary (tract) infections: Secondary | ICD-10-CM | POA: Insufficient documentation

## 2013-09-22 DIAGNOSIS — S01501A Unspecified open wound of lip, initial encounter: Secondary | ICD-10-CM | POA: Insufficient documentation

## 2013-09-22 DIAGNOSIS — S02401A Maxillary fracture, unspecified, initial encounter for closed fracture: Secondary | ICD-10-CM | POA: Insufficient documentation

## 2013-09-22 DIAGNOSIS — S02400A Malar fracture unspecified, initial encounter for closed fracture: Secondary | ICD-10-CM | POA: Insufficient documentation

## 2013-09-22 DIAGNOSIS — S0240DA Maxillary fracture, left side, initial encounter for closed fracture: Secondary | ICD-10-CM

## 2013-09-22 MED ORDER — HYDROCODONE-ACETAMINOPHEN 5-325 MG PO TABS: ORAL_TABLET | ORAL | Status: DC

## 2013-09-22 MED ORDER — AMOXICILLIN-POT CLAVULANATE 875-125 MG PO TABS: 1.0000 | ORAL_TABLET | Freq: Two times a day (BID) | ORAL | Status: DC

## 2013-09-22 MED ORDER — OXYCODONE-ACETAMINOPHEN 5-325 MG PO TABS
2.0000 | ORAL_TABLET | Freq: Once | ORAL | Status: AC
  Administered 2013-09-22: 2 via ORAL
  Filled 2013-09-22: qty 2

## 2013-09-22 MED ORDER — CEPHALEXIN 500 MG PO CAPS: 500.0000 mg | ORAL_CAPSULE | Freq: Two times a day (BID) | ORAL | Status: DC

## 2013-09-22 MED ORDER — LORAZEPAM 1 MG PO TABS
1.0000 mg | ORAL_TABLET | Freq: Once | ORAL | Status: AC
  Administered 2013-09-22: 1 mg via ORAL
  Filled 2013-09-22: qty 2

## 2013-09-22 NOTE — ED Notes (Signed)
Social worker at bedside.
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
## 2013-09-22 NOTE — Discharge Instructions (Signed)
Recommended that you swish with warm water after each meal to ensure that no food particles remaining in your wound. Swish gently and not vigorously to prevent opening of your wound. Followup with Dr. Pollyann Kennedyosen in the office as instructed for followup. Also recommend that you followup with an oral surgeon for further evaluation of your dental trauma. I have left a message with the office of Dr. Jeanice Limurham to notify them that you will be calling on Monday. Take Augmentin as prescribed to prevent infection. Also recommend he take ibuprofen 600 mg every 6 hours for pain control. He may take Norco as needed for severe pain. Return if symptoms worsen of signs of infection develop.  Open Wound, Lip An open wound is a break in the skin caused by an injury. An open wound to the lip can be a scrape, cut, or hole (puncture). Good wound care will help:   Lessen pain.  Prevent infection.  Lessen scarring. HOME CARE  Wash off all dirt.  Clean your wounds daily with gentle soap and water.  Eat soft foods or liquids while your wound is healing.  Rinse the wound with salt water after each meal.  Apply medicated cream after the wound has been cleaned as told by your doctor.  Follow up with your doctor as told. GET HELP RIGHT AWAY IF:   There is more redness or puffiness (swelling) in or around the wound.  There is increasing pain.  You have a temperature by mouth above 102 F (38.9 C), not controlled by medicine.  Your baby is older than 3 months with a rectal temperature of 102 F (38.9 C) or higher.  Your baby is 623 months old or younger with a rectal temperature of 100.4 F (38 C) or higher.  There is yellowish white fluid (pus) coming from the wound.  Very bad pain develops that is not controlled with medicine.  There is a red line on the skin above or below the wound. MAKE SURE YOU:   Understand these instructions.  Will watch this condition.  Will get help right away if you are not doing  well or get worse. Document Released: 07/30/2008 Document Revised: 07/26/2011 Document Reviewed: 08/19/2009 [facility] Patient Information 2014 [place], [place]. Dental Injury Your exam shows that you have injured your teeth. The treatment of broken teeth and other dental injuries depends on how badly they are hurt. All dental injuries should be checked as soon as possible by a dentist if there are:  Loose teeth which may need to be wired or bonded with a plastic device to hold them in place.  Broken teeth with exposed tooth pulp which may cause a serious infection.  Painful teeth especially when you bite or chew.  Sharp tooth edges that cut your tongue or lips. Sometimes, antibiotics or pain medicine are prescribed to prevent infection and control pain. Eat a soft or liquid diet and rinse your mouth out after meals with warm water. You should see a dentist or return here at once if you have increased swelling, increased pain or uncontrolled bleeding from the site of your injury. SEEK MEDICAL CARE IF:   You have increased pain not controlled with medicines.  You have swelling around your tooth, in your face or neck.  You have bleeding which starts, continues, or gets worse.  You have a fever. Document Released: 05/03/2005 Document Revised: 07/26/2011 Document Reviewed: 05/02/2009 [facility] Patient Information 2014 [place], [place].   Antibiotic ointment to lip 2 times daily.
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
## 2013-09-29 ENCOUNTER — Emergency Department (HOSPITAL_COMMUNITY)
Admission: EM | Admit: 2013-09-29 | Discharge: 2013-09-29 | Disposition: A | Discharge status: Home / Self Care | Payer: Self-pay | Attending: Emergency Medicine | Admitting: Emergency Medicine

## 2013-09-29 ENCOUNTER — Encounter (HOSPITAL_COMMUNITY): Payer: Self-pay | Admitting: Emergency Medicine

## 2013-09-29 DIAGNOSIS — B3731 Acute candidiasis of vulva and vagina: Secondary | ICD-10-CM | POA: Insufficient documentation

## 2013-09-29 DIAGNOSIS — Z87440 Personal history of urinary (tract) infections: Secondary | ICD-10-CM | POA: Insufficient documentation

## 2013-09-29 DIAGNOSIS — Z5189 Encounter for other specified aftercare: Secondary | ICD-10-CM

## 2013-09-29 DIAGNOSIS — Z4801 Encounter for change or removal of surgical wound dressing: Secondary | ICD-10-CM | POA: Insufficient documentation

## 2013-09-29 DIAGNOSIS — B373 Candidiasis of vulva and vagina: Secondary | ICD-10-CM | POA: Insufficient documentation

## 2013-09-29 DIAGNOSIS — Z87891 Personal history of nicotine dependence: Secondary | ICD-10-CM | POA: Insufficient documentation

## 2013-09-29 MED ORDER — HYDROCODONE-ACETAMINOPHEN 5-325 MG PO TABS
2.0000 | ORAL_TABLET | ORAL | Status: DC | PRN
Start: 2013-09-29 — End: 2013-11-08

## 2013-09-29 MED ORDER — HYDROCODONE-ACETAMINOPHEN 5-325 MG PO TABS
2.0000 | ORAL_TABLET | Freq: Once | ORAL | Status: AC
  Administered 2013-09-29: 2 via ORAL
  Filled 2013-09-29: qty 2

## 2013-09-29 MED ORDER — FLUCONAZOLE 100 MG PO TABS
150.0000 mg | ORAL_TABLET | Freq: Once | ORAL | Status: AC
  Administered 2013-09-29: 150 mg via ORAL
  Filled 2013-09-29: qty 2

## 2013-09-29 NOTE — Discharge Instructions (Signed)
Follow up with Dr. Monia Pouchiggs for your dental evaluation. Take Vicodin as needed for pain. Return to the ED with worsening or concerning symptoms.

## 2013-09-29 NOTE — ED Notes (Signed)
Resent with upper lip injury, received stitches last Saturday. Still reports pain. Finished antibiotics today. Also needs referral to a dentist.

## 2013-09-29 NOTE — ED Provider Notes (Signed)
CSN: [id number]     Arrival date & time 09/29/13  1512 History  This chart was scribed for  Emilia BeckKaitlyn Chanequa Spees, PA by Evon Slackerrance Branch, ED Scribe. This patient was seen in room TR04C/TR04C and the patient's care was started at 4:28 PM.    Chief Complaint  Patient presents with  . Wound Check   Patient is a 28 y.o. female presenting with wound check. The history is provided by the patient. No language interpreter was used.  Wound Check This is a new problem. The current episode started more than 1 week ago. The symptoms are aggravated by eating.   HPI Comments: Dana Wells is a 28 y.o. female who presents to the Emergency Department complaining of wound injury to upper lip. States she feels like she busted one of her sutures today while eating. States that's she received the stitches last Saturday. She states that theres associated pain still. She states that she also has required a  vaginal itch sense beginning her antibiotics. She stated that she was not able to go to the dentist without a referral. She states that she ran out of her antibiotics today.    Past Medical History  Diagnosis Date  . Tobacco use disorder   . History of recurrent urinary tract infection    Past Surgical History  Procedure Laterality Date  . Therapeutic abortion  08/2011   History reviewed. No pertinent family history. History  Substance Use Topics  . Smoking status: Former Smoker -- 0.50 packs/day    Types: Cigarettes    Quit date: 09/15/2011  . Smokeless tobacco: Never Used  . Alcohol Use: 0.5 oz/week    1 drink(s) per week     Comment: 3 drinks per weekend.   OB History   Grav Para Term Preterm Abortions TAB SAB Ect Mult Living                 Review of Systems  Genitourinary:       Vaginal itch  Skin: Positive for wound.       Upper lip  All other systems reviewed and are negative.  Allergies  Food allergy formula  Home Medications   Prior to Admission medications   Medication Sig Start  Date End Date Taking? Authorizing Provider  amoxicillin-clavulanate (AUGMENTIN) 875-125 MG per tablet Take 1 tablet by mouth every 12 (twelve) hours. 09/22/13  Yes Antony MaduraKelly Humes, PA-C  HYDROcodone-acetaminophen (NORCO/VICODIN) 5-325 MG per tablet Take 1-2 tablets by mouth every 6 hours as needed for pain. 09/22/13  Yes Antony MaduraKelly Humes, PA-C   Triage Vitals: BP 111/72  Pulse 84  Temp(Src) 98.5 F (36.9 C) (Oral)  Resp 16  Wt 172 lb 8 oz (78.245 kg)  SpO2 98%  LMP 09/21/2013  Physical Exam  Nursing note and vitals reviewed. Constitutional: She is oriented to person, place, and time. She appears well-developed and well-nourished. No distress.  HENT:  Head: Normocephalic and atraumatic.  Eyes: EOM are normal.  Neck: Neck supple. No tracheal deviation present.  Cardiovascular: Normal rate.   Pulmonary/Chest: Effort normal. No respiratory distress.  Musculoskeletal: Normal range of motion.  Neurological: She is alert and oriented to person, place, and time.  Skin: Skin is warm and dry.  Left upper suture intact, mild edema, no drainage or signs of infection noted.   Psychiatric: She has a normal mood and affect. Her behavior is normal.    ED Course  Procedures (including critical care time) DIAGNOSTIC STUDIES: Oxygen Saturation is 98% on RA,  Normal by my interpretation.    COORDINATION OF CARE: 4:35 PM-Discussed treatment plan which includes refill of antibiotics and referral to dentist with pt at bedside and pt agreed to plan.    Labs Review Labs Reviewed - No data to display  Imaging Review No results found.   EKG Interpretation None      MDM   Final diagnoses:  Visit for wound check  Vaginal yeast infection    Patient will have refill of pain medication and referral to a different dentist. Vitals stable and patient afebrile.   I personally performed the services described in this documentation, which was scribed in my presence. The recorded information has been reviewed  and is accurate.      Emilia BeckKaitlyn Gustava Berland, [place] JerseyPA-C 09/29/13 2046

## 2013-09-29 NOTE — ED Notes (Signed)
Pt. Also reporting vaginal itching. States "I think I have a yeast infection from the antibiotic I'm on". Pt. Also states needs a referral to dentist, since the one we gave her does not take her insurance.

## 2013-09-29 NOTE — ED Notes (Signed)
I gave the patient a cup of ice water. 

## 2013-10-01 NOTE — ED Provider Notes (Signed)
Medical screening examination/treatment/procedure(s) were performed by non-physician practitioner and as supervising physician I was immediately available for consultation/collaboration.   EKG Interpretation None       Dreana Britz R. Macklen Wilhoite, MD 10/01/13 0008 

## 2013-10-24 ENCOUNTER — Telehealth: Payer: Self-pay | Admitting: Family Medicine

## 2013-10-24 NOTE — Telephone Encounter (Signed)
I will continue to take care of her but she needs to come here instead of going to the emergency room for her care. If she continues to go to the ER for her care, I will dismiss her.

## 2013-10-24 NOTE — Telephone Encounter (Signed)
Pt called and left message crying.  Then called again while I was reviewing chart and reason for dismissal.  She states she saw you since she was 4 and she is now 2.  She has never seen another physician and she needs you now.  She thinks she is pregnant and is needing help.  She states she now has a good stable job and new address.  Please advise [phone]

## 2013-10-25 NOTE — Telephone Encounter (Signed)
Pt called and I advised of same.

## 2013-11-08 ENCOUNTER — Ambulatory Visit (INDEPENDENT_AMBULATORY_CARE_PROVIDER_SITE_OTHER): Payer: Self-pay | Admitting: Family Medicine

## 2013-11-08 VITALS — Wt 170.0 lb

## 2013-11-08 DIAGNOSIS — N921 Excessive and frequent menstruation with irregular cycle: Secondary | ICD-10-CM

## 2013-11-08 DIAGNOSIS — N923 Ovulation bleeding: Secondary | ICD-10-CM

## 2013-11-08 LAB — POCT URINE PREGNANCY: PREG TEST UR: NEGATIVE

## 2013-11-08 NOTE — Progress Notes (Signed)
   Subjective:    Patient ID: Dana Wells, female    DOB: 05/23/1985, 28 y.o.   MRN: [id number]  HPI She is here for evaluation of abnormal menses. Her echo was off and the bleeding was much less than normal. She also complains of breast tenderness as well as fatigue. She is interested in getting pregnant.   Review of Systems     Objective:   Physical Exam Joneen BoersLerch and in no distress otherwise not examined       Assessment & Plan:  Spotting between menses - Plan: POCT urine pregnancy, hCG, serum, qualitative

## 2013-11-09 LAB — HCG, SERUM, QUALITATIVE: Preg, Serum: NEGATIVE

## 2013-12-27 ENCOUNTER — Encounter: Payer: Self-pay | Admitting: Medical

## 2013-12-27 ENCOUNTER — Ambulatory Visit (INDEPENDENT_AMBULATORY_CARE_PROVIDER_SITE_OTHER): Payer: Self-pay | Admitting: Medical

## 2013-12-27 VITALS — BP 108/80 | HR 82 | Temp 98.3°F | Resp 15 | Wt 175.0 lb

## 2013-12-27 DIAGNOSIS — N949 Unspecified condition associated with female genital organs and menstrual cycle: Secondary | ICD-10-CM

## 2013-12-27 DIAGNOSIS — M545 Low back pain, unspecified: Secondary | ICD-10-CM

## 2013-12-27 DIAGNOSIS — K5900 Constipation, unspecified: Secondary | ICD-10-CM

## 2013-12-27 DIAGNOSIS — N898 Other specified noninflammatory disorders of vagina: Secondary | ICD-10-CM

## 2013-12-27 DIAGNOSIS — R109 Unspecified abdominal pain: Secondary | ICD-10-CM

## 2013-12-27 DIAGNOSIS — R102 Pelvic and perineal pain: Secondary | ICD-10-CM

## 2013-12-27 LAB — POCT URINALYSIS DIPSTICK
Bilirubin, UA: NEGATIVE
Glucose, UA: NEGATIVE
Ketones, UA: NEGATIVE
Leukocytes, UA: NEGATIVE
Nitrite, UA: NEGATIVE
PH UA: 8
PROTEIN UA: NEGATIVE
RBC UA: NEGATIVE
UROBILINOGEN UA: NEGATIVE

## 2013-12-27 LAB — POCT URINE PREGNANCY: Preg Test, Ur: NEGATIVE

## 2013-12-27 NOTE — Progress Notes (Signed)
Subjective; Here for really bad pain belly/back pain.  She notes 2 wk of pain, worse in the last 2 days.   Started out with pain in RUQ/flank and back, but now pain has shifted to the other side.  currently left flank pain/left groin pain.  Sharp pain, awoke her out of her sleep last night.  Thought it could be UTI, as she has had these prior.   LMP was 3 days early, started last week but was shorter than usual.   Last month had 1 day period, much shorter than usual.  No birth control.   Wants to have a baby.  In a relationship.    Review of Systems Constitutional: -fever, +chills, +sweats, -unexpected weight change,-fatigue ENT: -runny nose, -ear pain, -sore throat Cardiology:  -chest pain, -palpitations, -edema Respiratory: -cough, -shortness of breath, -wheezing Gastroenterology: +abdominal pain, -nausea, -vomiting, -diarrhea, +constipation, last BM over a week, but usually only has 1BM per week Hematology: -bleeding or bruising problems Musculoskeletal: +some pain in left upper back/shoulder ache, -arthralgias,  -joint swelling Ophthalmology: -vision changes Urology: -dysuria, -difficulty urinating, -hematuria, -urinary frequency, +urgency Neurology: -headache, -weakness, -tingling, -numbness Gyn: vaginal discharge clear last night, no itching or burning, thick, otherwise no vaginal c/o  Past Medical History  Diagnosis Date  . Tobacco use disorder   . History of recurrent urinary tract infection    Past Surgical History  Procedure Laterality Date  . Therapeutic abortion  08/2011    Objective  BP 108/80  Pulse 82  Temp(Src) 98.3 F (36.8 C) (Oral)  Resp 15  Wt 175 lb (79.379 kg)  LMP 12/19/2013  General appearance: alert, no distress, WD/WN, lean AA female Heart: RRR, normal S1, S2, no murmurs Lungs: CTA bilaterally, no wheezes, rhonchi, or rales Abdomen: +bs, soft, tender throughout, but worse LUQ and RLQ, otherwise non tender, non distended, no masses, no hepatomegaly, no  splenomegaly Back: left CVA tenderness Pulses: 2+ symmetric, upper and lower extremities, normal cap refill Gyn: Normal external genitalia without lesions, vagina with normal mucosa, cervix without lesions, no cervical motion tenderness, slight white abnormal vaginal discharge.  Uterus and adnexa not enlarged, nontender, no masses.  Exam chaperoned by nurse. Rectal: deferred   Assessment: Encounter Diagnoses  Name Primary?  . Abdominal pain, unspecified site Yes  . Pelvic pain in female   . Left-sided low back pain without sciatica   . Vaginal discharge   . Unspecified constipation     Plan: Wet prep and KOH swabs normal  She certainly has constipation which may be the main cause of her symptoms.  I doubt torsion but can't rule this out completely.  Her urinalysis and urine pregnancy tests are completely normal suggesting against urinary tract infection or pregnancy.  She declines STD screening currently  We discussed possible causes of her symptoms including constipation, ovarian torsion, ovarian cyst, pelvic inflammatory disease, pancreatitis, appendicitis or other.  We discussed her symptoms and concerns.  She is self-pay today.  I advised that without ultrasound and labs that I cannot completely rule out other problems.  She declines other testing today.  She will followup with the health department for STD screen.  She will begin milk of magnesia over-the-counter today and see how this helps with the pain and constipation.  Advised if much worse pain in the next 12-24 hours to call, return, or go to the emergency department  She'll call back to update me on symptoms within the next 24 hours  She understands and agrees with plan

## 2014-02-12 IMAGING — CT CT ABD-PELV W/ CM
1 of 2 series · 16 of 32 positions shown, 20 images · IV contrast (OMNIPAQUE 300)
Comparison: None.

CLINICAL DATA: Right lower quadrant pain.  Vomiting.  Diarrhea.

CT ABDOMEN AND PELVIS WITH CONTRAST
TECHNIQUE: Multidetector CT imaging of the abdomen and pelvis was
performed following the standard protocol during bolus
administration of intravenous contrast.
Contrast:  100 ml Pmnipaque-JRR and oral contrast

[Series 2: abd/pel with · axial · 0.74mm/px · z∈[+1188,+1592]mm · 16 of 89 slices shown, 20 images]
[im 4/89  soft-tissue]
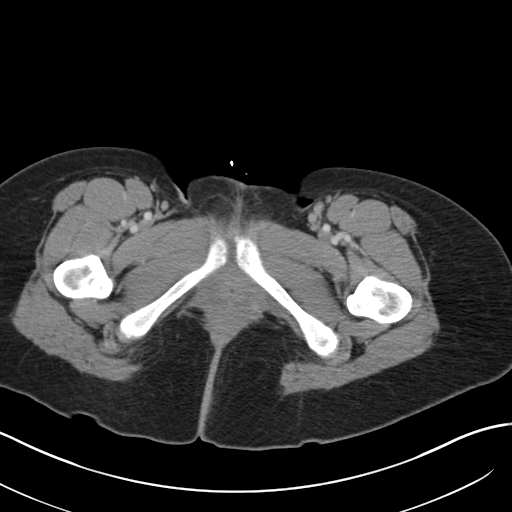
[im 4/89  bone]
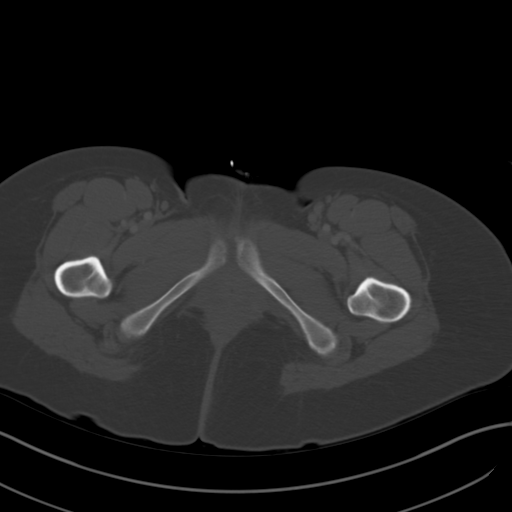
[im 11/89  soft-tissue]
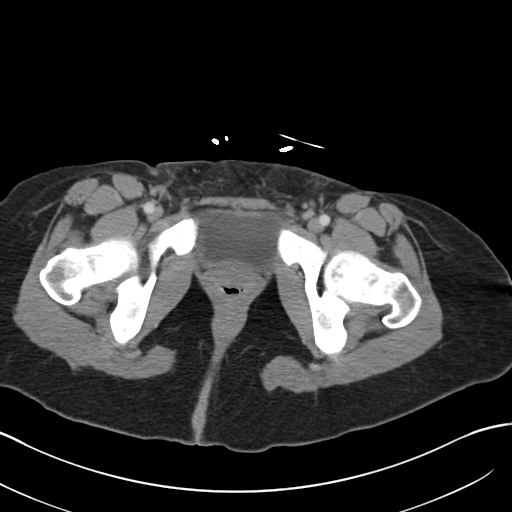
[im 17/89  soft-tissue]
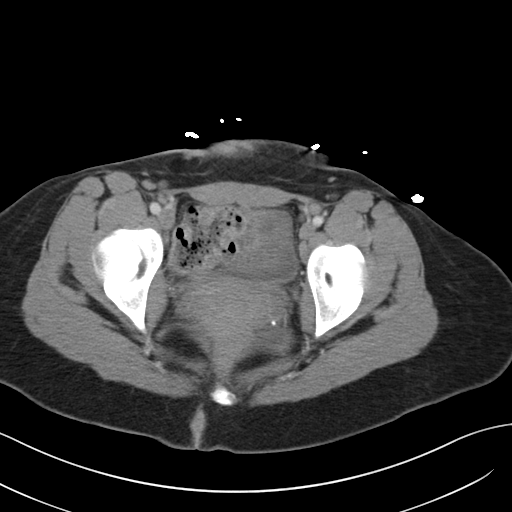
[im 24/89  soft-tissue]
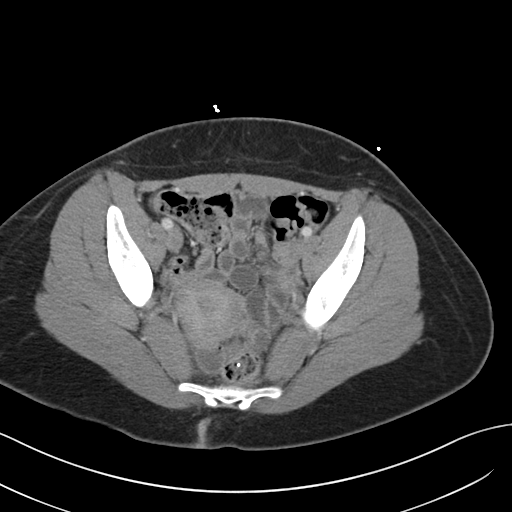
[im 31/89  soft-tissue]
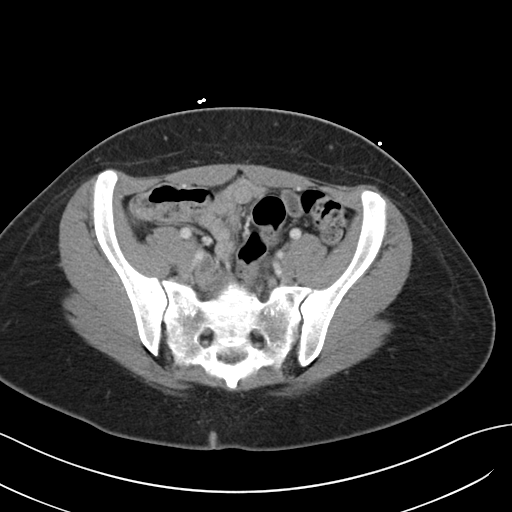
[im 34/89  soft-tissue]
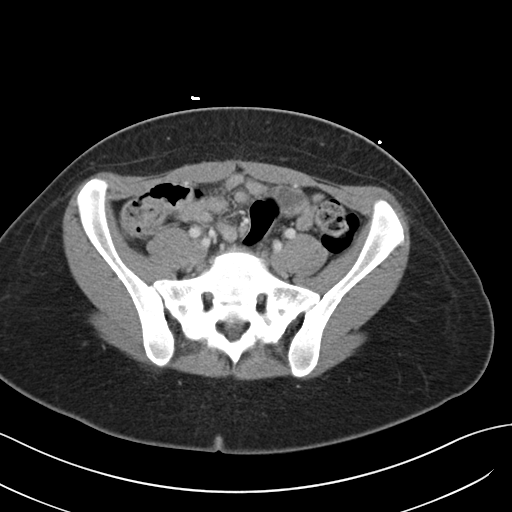
[im 41/89  soft-tissue]
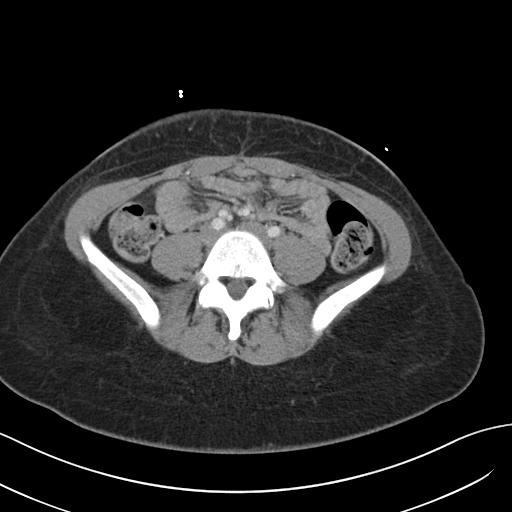
[im 48/89  soft-tissue]
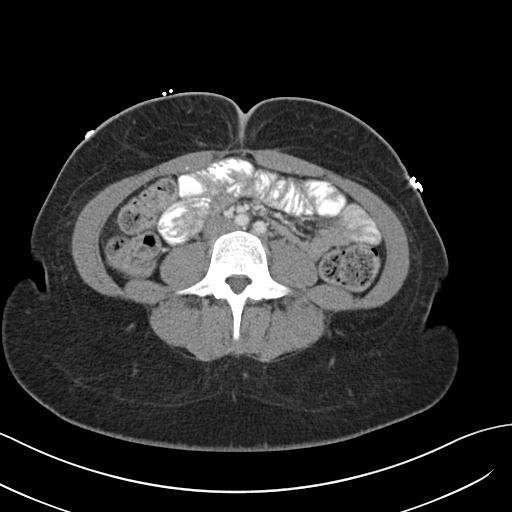
[im 55/89  soft-tissue]
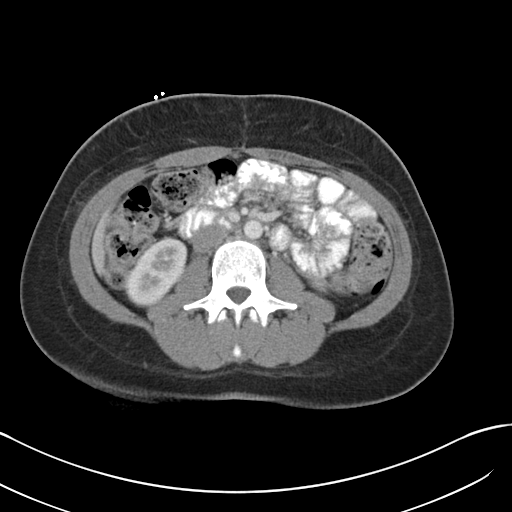
[im 55/89  bone]
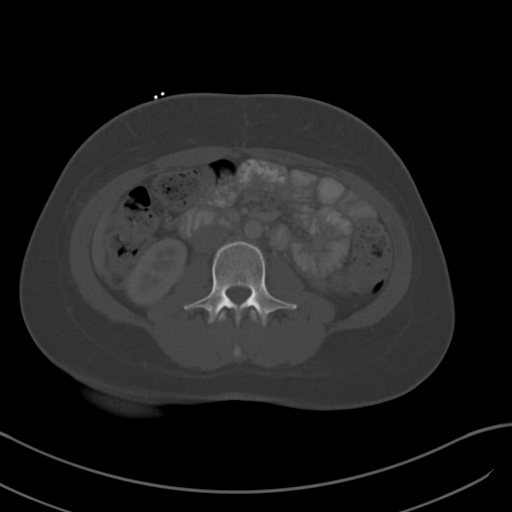
[im 58/89  soft-tissue]
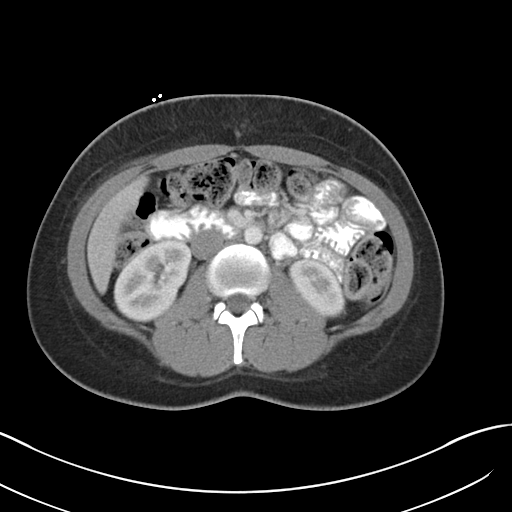
[im 65/89  soft-tissue]
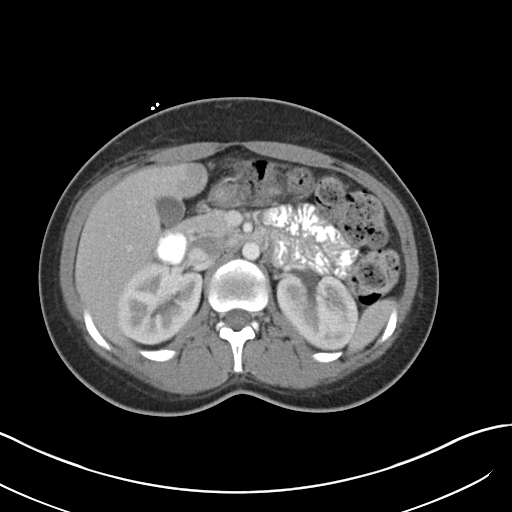
[im 72/89  soft-tissue]
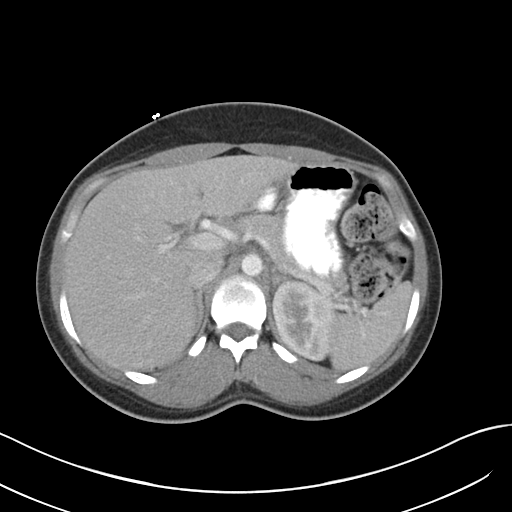
[im 75/89  lung]
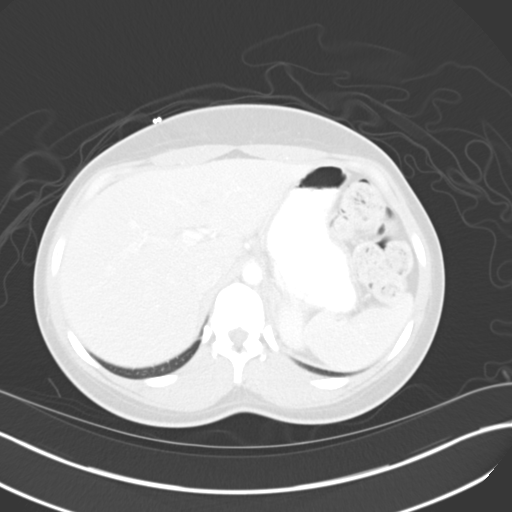
[im 78/89  soft-tissue]
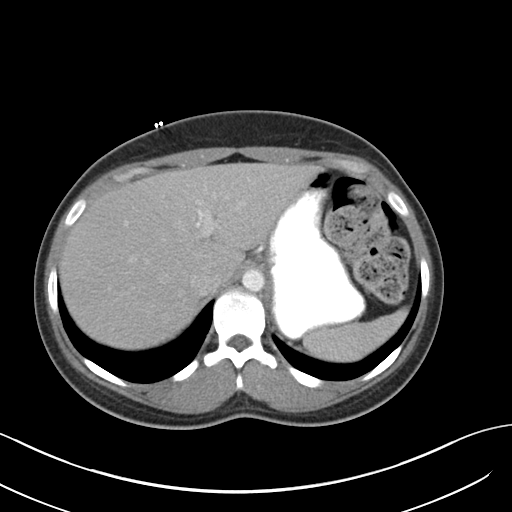
[im 78/89  lung]
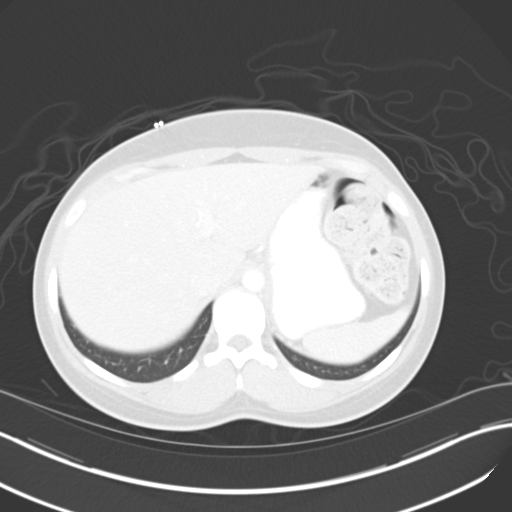
[im 82/89  lung]
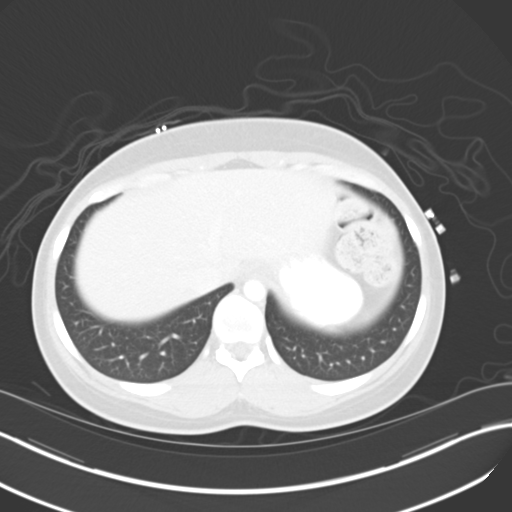
[im 85/89  soft-tissue]
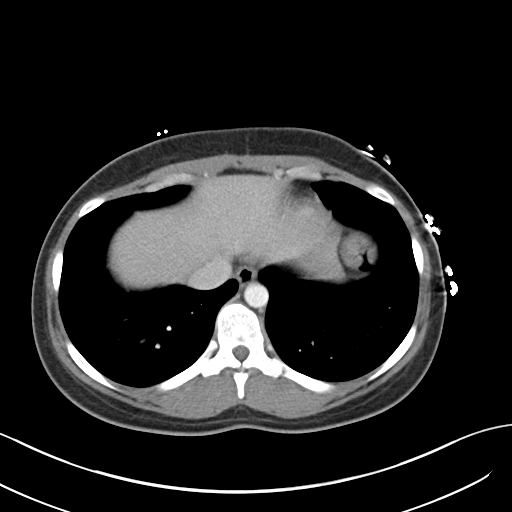
[im 85/89  lung]
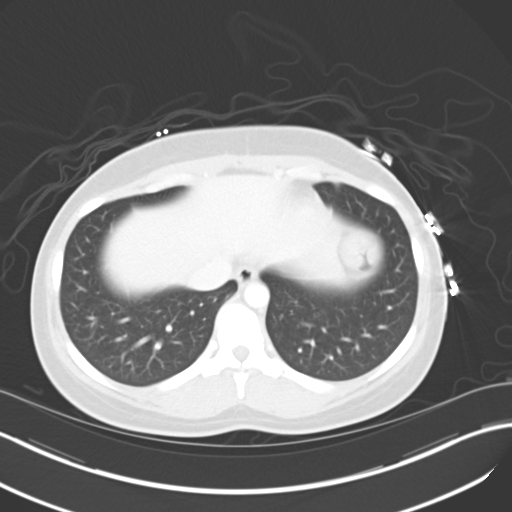

[16 of 32 positions shown; findings below may reference images not displayed]

FINDINGS: The abdominal parenchymal organs are normal in
appearance.  Gallbladder is unremarkable.  No evidence of
hydronephrosis.  Uterus and adnexal regions are unremarkable.  No
soft tissue masses or lymphadenopathy identified within the abdomen
or pelvis.

No evidence of inflammatory process or abnormal fluid collections.
Normal appendix is visualized.  No evidence of bowel wall
thickening, dilatation, or hernia.
IMPRESSION: Negative.  No acute findings or other significant abnormality
identified.

## 2014-12-29 ENCOUNTER — Emergency Department (HOSPITAL_BASED_OUTPATIENT_CLINIC_OR_DEPARTMENT_OTHER)
Admission: EM | Admit: 2014-12-29 | Discharge: 2014-12-29 | Disposition: A | Discharge status: Home / Self Care | Payer: Self-pay | Attending: Emergency Medicine | Admitting: Emergency Medicine

## 2014-12-29 ENCOUNTER — Encounter (HOSPITAL_BASED_OUTPATIENT_CLINIC_OR_DEPARTMENT_OTHER): Payer: Self-pay | Admitting: *Deleted

## 2014-12-29 DIAGNOSIS — N764 Abscess of vulva: Secondary | ICD-10-CM | POA: Insufficient documentation

## 2014-12-29 DIAGNOSIS — Z3202 Encounter for pregnancy test, result negative: Secondary | ICD-10-CM | POA: Insufficient documentation

## 2014-12-29 DIAGNOSIS — Z87440 Personal history of urinary (tract) infections: Secondary | ICD-10-CM | POA: Insufficient documentation

## 2014-12-29 DIAGNOSIS — Z720 Tobacco use: Secondary | ICD-10-CM | POA: Insufficient documentation

## 2014-12-29 LAB — URINE MICROSCOPIC-ADD ON

## 2014-12-29 LAB — URINALYSIS, ROUTINE W REFLEX MICROSCOPIC
GLUCOSE, UA: NEGATIVE mg/dL
HGB URINE DIPSTICK: NEGATIVE
Ketones, ur: NEGATIVE mg/dL
Nitrite: NEGATIVE
Protein, ur: NEGATIVE mg/dL
SPECIFIC GRAVITY, URINE: 1.024 (ref 1.005–1.030)
UROBILINOGEN UA: 4 mg/dL — AB (ref 0.0–1.0)
pH: 6 (ref 5.0–8.0)

## 2014-12-29 LAB — PREGNANCY, URINE: PREG TEST UR: NEGATIVE

## 2014-12-29 MED ORDER — OXYCODONE-ACETAMINOPHEN 5-325 MG PO TABS
1.0000 | ORAL_TABLET | ORAL | Status: DC | PRN
Start: 2014-12-29 — End: 2021-11-08

## 2014-12-29 MED ORDER — LIDOCAINE HCL (PF) 2 % IJ SOLN
INTRAMUSCULAR | Status: AC
  Administered 2014-12-29: 5 mL
  Filled 2014-12-29: qty 2

## 2014-12-29 MED ORDER — LIDOCAINE HCL 2 % IJ SOLN: 5.0000 mL | Freq: Once | INTRAMUSCULAR | Status: AC

## 2014-12-29 NOTE — ED Notes (Signed)
MD at bedside for I&D with chaperone present

## 2014-12-29 NOTE — ED Notes (Signed)
Per pt report she noted pressure in vaginal are, a boil came up this morning.

## 2014-12-29 NOTE — Discharge Instructions (Signed)
Abscess °An abscess is an infected area that contains a collection of pus and debris. It can occur in almost any part of the body. An abscess is also known as a furuncle or boil. °CAUSES  °An abscess occurs when tissue gets infected. This can occur from blockage of oil or sweat glands, infection of hair follicles, or a minor injury to the skin. As the body tries to fight the infection, pus collects in the area and creates pressure under the skin. This pressure causes pain. People with weakened immune systems have difficulty fighting infections and get certain abscesses more often.  °SYMPTOMS °Usually an abscess develops on the skin and becomes a painful mass that is red, warm, and tender. If the abscess forms under the skin, you may feel a moveable soft area under the skin. Some abscesses break open (rupture) on their own, but most will continue to get worse without care. The infection can spread deeper into the body and eventually into the bloodstream, causing you to feel ill.  °DIAGNOSIS  °Your caregiver will take your medical history and perform a physical exam. A sample of fluid may also be taken from the abscess to determine what is causing your infection. °TREATMENT  °Your caregiver may prescribe antibiotic medicines to fight the infection. However, taking antibiotics alone usually does not cure an abscess. Your caregiver may need to make a small cut (incision) in the abscess to drain the pus. In some cases, gauze is packed into the abscess to reduce pain and to continue draining the area. °HOME CARE INSTRUCTIONS  °· Only take over-the-counter or prescription medicines for pain, discomfort, or fever as directed by your caregiver. °· If you were prescribed antibiotics, take them as directed. Finish them even if you start to feel better. °· If gauze is used, follow your caregiver's directions for changing the gauze. °· To avoid spreading the infection: °· Keep your draining abscess covered with a  bandage. °· Wash your hands well. °· Do not share personal care items, towels, or whirlpools with others. °· Avoid skin contact with others. °· Keep your skin and clothes clean around the abscess. °· Keep all follow-up appointments as directed by your caregiver. °SEEK MEDICAL CARE IF:  °· You have increased pain, swelling, redness, fluid drainage, or bleeding. °· You have muscle aches, chills, or a general ill feeling. °· You have a fever. °MAKE SURE YOU:  °· Understand these instructions. °· Will watch your condition. °· Will get help right away if you are not doing well or get worse. °Document Released: 02/10/2005 Document Revised: 11/02/2011 Document Reviewed: 07/16/2011 °ExitCare® Patient Information ©2015 [employer]. This information is not intended to replace advice given to you by your health care provider. Make sure you discuss any questions you have with your health care provider. ° °Abscess °Care After °An abscess (also called a boil or furuncle) is an infected area that contains a collection of pus. Signs and symptoms of an abscess include pain, tenderness, redness, or hardness, or you may feel a moveable soft area under your skin. An abscess can occur anywhere in the body. The infection may spread to surrounding tissues causing cellulitis. A cut (incision) by the surgeon was made over your abscess and the pus was drained out. Gauze may have been packed into the space to provide a drain that will allow the cavity to heal from the inside outwards. The boil may be painful for 5 to 7 days. Most people with a boil do not have   high fevers. Your abscess, if seen early, may not have localized, and may not have been lanced. If not, another appointment may be required for this if it does not get better on its own or with medications. °HOME CARE INSTRUCTIONS  °· Only take over-the-counter or prescription medicines for pain, discomfort, or fever as directed by your caregiver. °· When you bathe, soak and then  remove gauze or iodoform packs at least daily or as directed by your caregiver. You may then wash the wound gently with mild soapy water. Repack with gauze or do as your caregiver directs. °SEEK IMMEDIATE MEDICAL CARE IF:  °· You develop increased pain, swelling, redness, drainage, or bleeding in the wound site. °· You develop signs of generalized infection including muscle aches, chills, fever, or a general ill feeling. °· An oral temperature above 102° F (38.9° C) develops, not controlled by medication. °See your caregiver for a recheck if you develop any of the symptoms described above. If medications (antibiotics) were prescribed, take them as directed. °Document Released: 11/19/2004 Document Revised: 07/26/2011 Document Reviewed: 07/17/2007 °ExitCare® Patient Information ©2015 [employer]. This information is not intended to replace advice given to you by your health care provider. Make sure you discuss any questions you have with your health care provider. ° °

## 2014-12-29 NOTE — ED Notes (Signed)
MD at bedside. 

## 2014-12-29 NOTE — ED Provider Notes (Signed)
CSN: [id number]     Arrival date & time 12/29/14  1059 History   First MD Initiated Contact with Patient 12/29/14 1115     Chief Complaint  Patient presents with  . vaginal complaint      (Consider location/radiation/quality/duration/timing/severity/associated sxs/prior Treatment) HPI Comments: Patient here with 24-hour history of swelling to her right labia minora. Denies any vaginal discharge. No active bleeding noted. No fever or chills. Area is tender to touch and characterized as sharp and worse with any Movement. Symptoms are persistent and nothing makes them better. No treatment used prior to arrival  The history is provided by the patient.    Past Medical History  Diagnosis Date  . Tobacco use disorder   . History of recurrent urinary tract infection    Past Surgical History  Procedure Laterality Date  . Therapeutic abortion  08/2011   History reviewed. No pertinent family history. Social History  Substance Use Topics  . Smoking status: Light Tobacco Smoker -- 0.50 packs/day    Types: Cigarettes    Last Attempt to Quit: 09/15/2011  . Smokeless tobacco: Never Used  . Alcohol Use: 0.5 oz/week    1 drink(s) per week     Comment: 3 drinks per weekend.   OB History    No data available     Review of Systems  All other systems reviewed and are negative.     Allergies  Food allergy formula  Home Medications   Prior to Admission medications   Not on File   BP 123/79 mmHg  Pulse 84  Temp(Src) 98.2 F (36.8 C) (Oral)  Ht 5\' 8"  (1.727 m)  Wt 167 lb (75.751 kg)  BMI 25.40 kg/m2  SpO2 100%  LMP 12/01/2014 Physical Exam  Constitutional: She is oriented to person, place, and time. She appears well-developed and well-nourished.  Non-toxic appearance. No distress.  HENT:  Head: Normocephalic and atraumatic.  Eyes: Conjunctivae, EOM and lids are normal. Pupils are equal, round, and reactive to light.  Neck: Normal range of motion. Neck supple. No tracheal  deviation present. No thyroid mass present.  Cardiovascular: Normal rate, regular rhythm and normal heart sounds.  Exam reveals no gallop.   No murmur heard. Pulmonary/Chest: Effort normal and breath sounds normal. No stridor. No respiratory distress. She has no decreased breath sounds. She has no wheezes. She has no rhonchi. She has no rales.  Abdominal: Soft. Normal appearance and bowel sounds are normal. She exhibits no distension. There is no tenderness. There is no rebound and no CVA tenderness.  Genitourinary:     Musculoskeletal: Normal range of motion. She exhibits no edema or tenderness.  Neurological: She is alert and oriented to person, place, and time. She has normal strength. No cranial nerve deficit or sensory deficit. GCS eye subscore is 4. GCS verbal subscore is 5. GCS motor subscore is 6.  Skin: Skin is warm and dry. No abrasion and no rash noted.  Psychiatric: She has a normal mood and affect. Her speech is normal and behavior is normal.  Nursing note and vitals reviewed.   ED Course  Procedures (including critical care time) Labs Review Labs Reviewed  URINALYSIS, ROUTINE W REFLEX MICROSCOPIC (NOT AT [facility])  PREGNANCY, URINE    Imaging Review No results found. I, Toy Baker, personally reviewed and evaluated these images and lab results as part of my medical decision-making.   EKG Interpretation None      MDM   Final diagnoses:  None    INCISION  AND DRAINAGE Performed by: Toy Baker Consent: Verbal consent obtained. Risks and benefits: risks, benefits and alternatives were discussed Time out performed prior to procedure Type: abscess Body area: right labia minora Anesthesia: local infiltration Incision was made with a scalpel. Local anesthetic: lidocaine 2% 0 epinephrine Anesthetic total: 5 ml Complexity: complex Blunt dissection to break up loculations Drainage: purulent Drainage amount: monderate Packing material: none Patient  tolerance: Patient tolerated the procedure well with no immediate complications.  No evidence of surrounding cellulitis. Will be given a short prescription of pain meds.     Lorre Nick, MD 12/29/14 1141

## 2014-12-29 NOTE — ED Notes (Signed)
Rx x 1 given for percocet 

## 2016-03-14 ENCOUNTER — Encounter (HOSPITAL_COMMUNITY): Payer: Self-pay

## 2016-03-14 ENCOUNTER — Emergency Department (HOSPITAL_COMMUNITY)
Admission: EM | Admit: 2016-03-14 | Discharge: 2016-03-14 | Disposition: A | Discharge status: Home / Self Care | Payer: Self-pay | Attending: Emergency Medicine | Admitting: Emergency Medicine

## 2016-03-14 DIAGNOSIS — F17210 Nicotine dependence, cigarettes, uncomplicated: Secondary | ICD-10-CM | POA: Insufficient documentation

## 2016-03-14 DIAGNOSIS — N3001 Acute cystitis with hematuria: Secondary | ICD-10-CM | POA: Insufficient documentation

## 2016-03-14 DIAGNOSIS — K0889 Other specified disorders of teeth and supporting structures: Secondary | ICD-10-CM | POA: Insufficient documentation

## 2016-03-14 LAB — URINALYSIS, ROUTINE W REFLEX MICROSCOPIC
Bilirubin Urine: NEGATIVE
Glucose, UA: NEGATIVE mg/dL
Ketones, ur: NEGATIVE mg/dL
Nitrite: NEGATIVE
PROTEIN: 100 mg/dL — AB
Specific Gravity, Urine: 1.021 (ref 1.005–1.030)
pH: 7 (ref 5.0–8.0)

## 2016-03-14 LAB — URINE MICROSCOPIC-ADD ON

## 2016-03-14 LAB — POC URINE PREG, ED: Preg Test, Ur: NEGATIVE

## 2016-03-14 MED ORDER — CEPHALEXIN 500 MG PO CAPS: 500.0000 mg | ORAL_CAPSULE | Freq: Two times a day (BID) | ORAL | 0 refills | Status: DC

## 2016-03-14 MED ORDER — NAPROXEN 500 MG PO TABS: 500.0000 mg | ORAL_TABLET | Freq: Two times a day (BID) | ORAL | 0 refills | Status: DC

## 2016-03-14 NOTE — ED Triage Notes (Addendum)
Pt c/o dysuria, urinary frequency, and urinary urgency x 2 days and left upper dental pain r/t broken tooth x 3 days.  Sts "I think, I saw blood in it this morning."  Pain score 10/10.  Pt reports taking AZO w/o relief.

## 2016-03-14 NOTE — Discharge Instructions (Signed)
Take the prescribed medication as directed. Follow-up with dentist in the area.  There is no dentist on call today, see list of clinics in the area for follow-up. Return to the ED for new or worsening symptoms.

## 2016-03-14 NOTE — ED Provider Notes (Signed)
[facility]-EMERGENCY DEPT Provider Note   CSN: [id number] Arrival date & time: 03/14/16  1251     History   Chief Complaint Chief Complaint  Patient presents with  . Dysuria  . Dental Pain    HPI Dana Wells is a 30 y.o. female.  The history is provided by the patient and medical records.  Dysuria   Associated symptoms include frequency.  Dental Pain    30 y.o. F presenting to the ED for dysuria. Patient states she has a history of UTIs but has not had one in the past 2 years. States she was told before it was from not drinking enough water and not urinating after having intercourse. She states she did have intercourse one week ago and fell asleep afterwards. States symptoms began 2 days later. She reports pain with urination, urinary frequency, and some mild hematuria this morning. She denies any abdominal pain, flank pain, fever, or chills. She has tried taking some AZO without relief.  Patient also complains some left upper dental pain. She states her left upper molar cracked while she was eating a few days ago. States pain when food or liquids touches the area. She states she believes a piece of her root is exposed. She denies any facial swelling. No difficult swallowing. She is not currently established with a dentist.  Past Medical History:  Diagnosis Date  . History of recurrent urinary tract infection   . Tobacco use disorder     Patient Active Problem List   Diagnosis Date Noted  . History of UTI 06/30/2011    Past Surgical History:  Procedure Laterality Date  . THERAPEUTIC ABORTION  08/2011    OB History    No data available       Home Medications    Prior to Admission medications   Medication Sig Start Date End Date Taking? Authorizing Provider  Multiple Vitamin (MULTIVITAMIN WITH MINERALS) TABS tablet Take 1 tablet by mouth daily.   Yes Historical Provider, MD  phenazopyridine (PYRIDIUM) 95 MG tablet Take 95 mg by mouth 3 (three) times daily as needed for  pain.   Yes Historical Provider, MD  oxyCODONE-acetaminophen (PERCOCET/ROXICET) 5-325 MG per tablet Take 1-2 tablets by mouth every 4 (four) hours as needed for severe pain. Patient not taking: Reported on 03/14/2016 12/29/14   Lorre NickAnthony Allen, MD    Family History History reviewed. No pertinent family history.  Social History Social History  Substance Use Topics  . Smoking status: Light Tobacco Smoker    Packs/day: 0.50    Types: Cigarettes    Last attempt to quit: 09/15/2011  . Smokeless tobacco: Never Used  . Alcohol use 0.5 oz/week    1 Standard drinks or equivalent per week     Comment: 3 drinks per weekend.     Allergies   Food allergy formula   Review of Systems Review of Systems  HENT: Positive for dental problem.   Genitourinary: Positive for dysuria and frequency.  All other systems reviewed and are negative.    Physical Exam Updated Vital Signs BP 103/73 (BP Location: Left Arm)   Pulse 74   Temp 97.7 F (36.5 C) (Oral)   Resp 16   LMP 03/03/2016   SpO2 100%   Physical Exam  Constitutional: She is oriented to person, place, and time. She appears well-developed and well-nourished.  HENT:  Head: Normocephalic and atraumatic.  Mouth/Throat: Oropharynx is clear and moist.  Teeth largely in good dentition, left upper 3rd molar broken with  exposed root, surrounding gingiva normal in appearance, no signs of abscess formation, handling secretions appropriately, no trismus, no facial or neck swelling, normal phonation without stridor  Eyes: Conjunctivae and EOM are normal. Pupils are equal, round, and reactive to light.  Neck: Normal range of motion.  Cardiovascular: Normal rate, regular rhythm and normal heart sounds.   Pulmonary/Chest: Effort normal and breath sounds normal.  Abdominal: Soft. Bowel sounds are normal. There is no tenderness. There is no rigidity, no guarding and no CVA tenderness.  No CVA tenderness  Musculoskeletal: Normal range of motion.    Neurological: She is alert and oriented to person, place, and time.  Skin: Skin is warm and dry.  Psychiatric: She has a normal mood and affect.  Nursing note and vitals reviewed.    ED Treatments / Results  Labs (all labs ordered are listed, but only abnormal results are displayed) Labs Reviewed  URINALYSIS, ROUTINE W REFLEX MICROSCOPIC ([facility]) - Abnormal; Notable for the following:       Result Value   APPearance TURBID (*)    Hgb urine dipstick LARGE (*)    Protein, ur 100 (*)    Leukocytes, UA MODERATE (*)    All other components within normal limits  URINE MICROSCOPIC-ADD ON - Abnormal; Notable for the following:    Squamous Epithelial / LPF 0-5 (*)    Bacteria, UA MANY (*)    Casts GRANULAR CAST (*)    All other components within normal limits  URINE CULTURE  POC URINE PREG, ED    EKG  EKG Interpretation None       Radiology No results found.  Procedures Procedures (including critical care time)  Medications Ordered in ED Medications - No data to display   Initial Impression / Assessment and Plan / ED Course  I have reviewed the triage vital signs and the nursing notes.  Pertinent labs & imaging results that were available during my care of the patient were reviewed by me and considered in my medical decision making (see chart for details).  Clinical Course   30 year old female here with UTI symptoms. She is afebrile and nontoxic. Abdomen is soft and benign. No CVA tenderness. UA is consistent with UTI. Will start on Keflex pending urine culture.  FU with PCP.  Patient also has left upper dental pain secondary to cracked tooth. Does not appear to be any signs of surrounding infection or abscess. She is handling her secretions well, normal phonation without stridor. Clinically not concerning for Ludwig angina. Start on Naprosyn for pain control, follow-up with dentist.  Discussed plan with patient, she acknowledged understanding and agreed with plan  of care.  Return precautions given for new or worsening symptoms.  Final Clinical Impressions(s) / ED Diagnoses   Final diagnoses:  Pain, dental  Acute cystitis with hematuria    New Prescriptions Discharge Medication List as of 03/14/2016  2:53 PM    START taking these medications   Details  cephALEXin (KEFLEX) 500 MG capsule Take 1 capsule (500 mg total) by mouth 2 (two) times daily., Starting Sun 03/14/2016, Print    naproxen (NAPROSYN) 500 MG tablet Take 1 tablet (500 mg total) by mouth 2 (two) times daily with a meal., Starting Sun 03/14/2016, Print         Garlon HatchetLisa M Cheryl Stabenow, PA-C 03/14/16 1524    Donnetta HutchingBrian Cook, MD 03/14/16 [phone]

## 2016-03-16 LAB — URINE CULTURE

## 2016-03-17 ENCOUNTER — Telehealth (HOSPITAL_BASED_OUTPATIENT_CLINIC_OR_DEPARTMENT_OTHER): Payer: Self-pay | Admitting: *Deleted

## 2016-03-17 NOTE — Telephone Encounter (Signed)
Post ED Visit - Positive Culture Follow-up  Culture report reviewed by antimicrobial stewardship pharmacist:  []  Enzo BiNathan Batchelder, Pharm.D. [x]  Celedonio MiyamotoJeremy Frens, Pharm.D., BCPS []  Garvin FilaMike Maccia, Pharm.D. []  Georgina PillionElizabeth Martin, Pharm.D., BCPS []  [place]h Pham, [place]arm.D., BCPS, AAHIVP []  Estella HuskMichelle Turner, Pharm.D., BCPS, AAHIVP []  Tennis Mustassie Stewart, Pharm.D. []  Sherle Poeob Vincent, [address]Pharm[address]  Positive urine culture Treated with Cephalexin, organism sensitive to the same and no further patient follow-up is required at this time.  Virl AxeRobertson, Lismary Kiehn [facility] 03/17/2016, 1:57 PM

## 2021-02-05 ENCOUNTER — Emergency Department (HOSPITAL_COMMUNITY)
Admission: EM | Admit: 2021-02-05 | Discharge: 2021-02-05 | Discharge status: Against Medical Advice | Payer: BC Managed Care – PPO

## 2021-02-05 NOTE — ED Notes (Signed)
Patient told this tech she would not be able to wait any longer, patient was encouraged to stay. Patient left without being seen.

## 2021-02-06 ENCOUNTER — Emergency Department (HOSPITAL_BASED_OUTPATIENT_CLINIC_OR_DEPARTMENT_OTHER)
Admission: EM | Admit: 2021-02-06 | Discharge: 2021-02-06 | Disposition: A | Discharge status: Home / Self Care | Payer: BC Managed Care – PPO | Attending: Emergency Medicine | Admitting: Emergency Medicine

## 2021-02-06 ENCOUNTER — Other Ambulatory Visit: Payer: Self-pay

## 2021-02-06 ENCOUNTER — Encounter (HOSPITAL_BASED_OUTPATIENT_CLINIC_OR_DEPARTMENT_OTHER): Payer: Self-pay

## 2021-02-06 DIAGNOSIS — N764 Abscess of vulva: Secondary | ICD-10-CM | POA: Diagnosis present

## 2021-02-06 DIAGNOSIS — F17210 Nicotine dependence, cigarettes, uncomplicated: Secondary | ICD-10-CM | POA: Insufficient documentation

## 2021-02-06 HISTORY — DX: Anxiety disorder, unspecified: F41.9

## 2021-02-06 MED ORDER — BUPIVACAINE HCL (PF) 0.5 % IJ SOLN
10.0000 mL | Freq: Once | INTRAMUSCULAR | Status: AC
  Administered 2021-02-06: 10 mL
  Filled 2021-02-06: qty 10

## 2021-02-06 MED ORDER — HYDROCODONE-ACETAMINOPHEN 5-325 MG PO TABS: 1.0000 | ORAL_TABLET | Freq: Four times a day (QID) | ORAL | 0 refills | Status: DC | PRN

## 2021-02-06 MED ORDER — IBUPROFEN 400 MG PO TABS
600.0000 mg | ORAL_TABLET | Freq: Once | ORAL | Status: AC
  Administered 2021-02-06: 600 mg via ORAL
  Filled 2021-02-06: qty 1

## 2021-02-06 MED ORDER — LIDOCAINE-EPINEPHRINE (PF) 2 %-1:200000 IJ SOLN
10.0000 mL | Freq: Once | INTRAMUSCULAR | Status: AC
  Administered 2021-02-06: 10 mL
  Filled 2021-02-06: qty 20

## 2021-02-06 MED ORDER — ACETAMINOPHEN 325 MG PO TABS
650.0000 mg | ORAL_TABLET | Freq: Once | ORAL | Status: AC
  Administered 2021-02-06: 650 mg via ORAL
  Filled 2021-02-06: qty 2

## 2021-02-06 MED ORDER — DOXYCYCLINE HYCLATE 100 MG PO CAPS: 100.0000 mg | ORAL_CAPSULE | Freq: Two times a day (BID) | ORAL | 0 refills | Status: AC

## 2021-02-06 NOTE — ED Provider Notes (Signed)
[facility] EMERGENCY DEPARTMENT Provider Note   CSN: [id number] Arrival date & time: 02/06/21  1337     History Chief Complaint  Patient presents with   Abscess    Dana Wells is a 35 y.o. female with a past medical history of tobacco use, presents today for evaluation of a labial abscess.  She states that 3 days ago she developed painful swelling.  She has tried warm sitz bath's without relief.  She states that she has had 2 of these previously that required incision.  She denies any fevers.  She went to urgent care prior to coming here where she had a UA that did not show infection per her report and a negative pregnancy test per her report.   HPI     Past Medical History:  Diagnosis Date   Anxiety    History of recurrent urinary tract infection    Tobacco use disorder     Patient Active Problem List   Diagnosis Date Noted   History of UTI 06/30/2011    Past Surgical History:  Procedure Laterality Date   THERAPEUTIC ABORTION  08/2011     OB History   No obstetric history on file.     No family history on file.  Social History   Tobacco Use   Smoking status: Every Day    Packs/day: 0.50    Types: Cigarettes   Smokeless tobacco: Never  Substance Use Topics   Alcohol use: Yes    Alcohol/week: 1.0 standard drink    Types: 1 Standard drinks or equivalent per week    Comment: daily   Drug use: Yes    Types: Marijuana    Home Medications Prior to Admission medications   Medication Sig Start Date End Date Taking? Authorizing Provider  cephALEXin (KEFLEX) 500 MG capsule Take 1 capsule (500 mg total) by mouth 2 (two) times daily. 03/14/16   Garlon Hatchet, PA-C  Multiple Vitamin (MULTIVITAMIN WITH MINERALS) TABS tablet Take 1 tablet by mouth daily.    [provider]  naproxen (NAPROSYN) 500 MG tablet Take 1 tablet (500 mg total) by mouth 2 (two) times daily with a meal. 03/14/16   Garlon Hatchet, PA-C  oxyCODONE-acetaminophen  (PERCOCET/ROXICET) 5-325 MG per tablet Take 1-2 tablets by mouth every 4 (four) hours as needed for severe pain. Patient not taking: Reported on 03/14/2016 12/29/14   Lorre Nick, MD  phenazopyridine (PYRIDIUM) 95 MG tablet Take 95 mg by mouth 3 (three) times daily as needed for pain.    [provider]    Allergies    Patient has no known allergies.  Review of Systems   Review of Systems  Constitutional:  Negative for chills and fever.  Respiratory:  Negative for cough.   Cardiovascular:  Negative for chest pain.  Genitourinary:  Positive for vaginal pain. Negative for dysuria, menstrual problem, pelvic pain and vaginal discharge.  Neurological:  Negative for weakness and headaches.  All other systems reviewed and are negative.  Physical Exam Updated Vital Signs BP 111/88 (BP Location: Right Arm)   Pulse 64   Temp 98.3 F (36.8 C) (Oral)   Resp 15   Ht 5\' 7"  (1.702 m)   Wt 73 kg   LMP 01/29/2021   SpO2 100%   BMI 25.22 kg/m   Physical Exam Vitals and nursing note reviewed. Exam conducted with a chaperone present (Female ED tech or RN present during exam.).  Constitutional:      General: She  is not in acute distress.    Appearance: She is not diaphoretic.  HENT:     Head: Normocephalic and atraumatic.  Eyes:     General: No scleral icterus.       Right eye: No discharge.        Left eye: No discharge.     Conjunctiva/sclera: Conjunctivae normal.  Cardiovascular:     Rate and Rhythm: Normal rate and regular rhythm.  Pulmonary:     Effort: Pulmonary effort is normal. No respiratory distress.     Breath sounds: No stridor.  Abdominal:     General: There is no distension.  Genitourinary:    Comments: Left labia minora has a 2 cm area of swelling with TTP, and fluctuance on the lateral side.  There is no active drainage.  There is minimal surrounding erythema and minimal surrounding induration   Musculoskeletal:        General: No deformity.     Cervical  back: Normal range of motion.  Skin:    General: Skin is warm and dry.  Neurological:     Mental Status: She is alert.     Motor: No abnormal muscle tone.  Psychiatric:        Behavior: Behavior normal.    ED Results / Procedures / Treatments   Labs (all labs ordered are listed, but only abnormal results are displayed) Labs Reviewed - No data to display  EKG None  Radiology No results found.  Procedures .Marland KitchenIncision and Drainage  Date/Time: 02/06/2021 5:32 PM Performed by: Cristina Gong, PA-C Authorized by: Cristina Gong, PA-C   Consent:    Consent obtained:  Verbal   Consent given by:  Patient   Risks, benefits, and alternatives were discussed: yes     Risks discussed:  Bleeding, incomplete drainage, pain, infection and damage to other organs (Damage to other structures, need for additional procedures)   Alternatives discussed:  No treatment, alternative treatment and referral Universal protocol:    Procedure explained and questions answered to patient or proxy's satisfaction: yes   Location:    Type:  Abscess   Size:  2 cm   Location:  Anogenital   Anogenital location: Left labia minora. Pre-procedure details:    Skin preparation:  Povidone-iodine Sedation:    Sedation type:  None Anesthesia:    Anesthesia method:  Local infiltration   Local anesthetic:  Lidocaine 2% WITH epi and bupivacaine 0.5% w/o epi (20/80 mixture of lido with epi : marcaeine, total of 1.5 cc used) Procedure type:    Complexity:  Complex Procedure details:    Incision types:  Stab incision   Incision depth:  Subcutaneous   Wound management:  Probed and deloculated and irrigated with saline   Drainage:  Purulent   Drainage amount:  Scant   Wound treatment:  Wound left open   Packing materials:  None Post-procedure details:    Procedure completion:  Tolerated well, no immediate complications Comments:     Patient didn't tolerate irrigation well, was only able to irrigate  about 5ccs of NS.    Medications Ordered in ED Medications  lidocaine-EPINEPHrine (XYLOCAINE W/EPI) 2 %-1:200000 (PF) injection 10 mL (10 mLs Infiltration Given by Other 02/06/21 1722)  bupivacaine (MARCAINE) 0.5 % injection 10 mL (10 mLs Infiltration Given 02/06/21 1723)  ibuprofen (ADVIL) tablet 600 mg (600 mg Oral Given 02/06/21 1656)  acetaminophen (TYLENOL) tablet 650 mg (650 mg Oral Given 02/06/21 1656)    ED Course  I have reviewed  the triage vital signs and the nursing notes.  Pertinent labs & imaging results that were available during my care of the patient were reviewed by me and considered in my medical decision making (see chart for details).    MDM Rules/Calculators/A&P                         Patient is a 35 year old woman who presents today for evaluation of 3 days of painful swelling to her left labia minora.  Chaperoned exam shows a approximately 2 cm abscess.    I discussed options with patient for treatment including conservative treatment with antibiotics and warm soaks versus I&D.  She wishes for I&D.  She gave verbal consent for procedure which was performed, please see procedure note for details.  She reports she had a negative pregnancy test prior to arrival in urgent care.  We will start her on doxycycline.  Cavity was not large enough to require drain or packing.  Return precautions were discussed with patient who states their understanding.  At the time of discharge patient denied any unaddressed complaints or concerns.  Patient is agreeable for discharge home.  Note: Portions of this report may have been transcribed using voice recognition software. Every effort was made to ensure accuracy; however, inadvertent computerized transcription errors may be present    Final Clinical Impression(s) / ED Diagnoses Final diagnoses:  Labial abscess    Rx / DC Orders ED Discharge Orders     None        Norman Clay 02/07/21 0053    Benjiman Core, MD 02/07/21 [id number]

## 2021-02-06 NOTE — ED Notes (Signed)
D/c paperwork reviewed with pt, including prescriptions and f/u care. Pt verbalized understanding, ambulatory to ED exit.

## 2021-02-06 NOTE — Discharge Instructions (Addendum)
Please take Ibuprofen (Advil, motrin) and Tylenol (acetaminophen) to relieve your pain.    You may take up to 600 MG (3 pills) of normal strength ibuprofen every 8 hours as needed.   You make take tylenol, up to 1,000 mg (two extra strength pills) every 8 hours as needed.   It is safe to take ibuprofen and tylenol at the same time as they work differently.   Do not take more than 3,000 mg tylenol in a 24 hour period (not more than one dose every 8 hours.  Please check all medication labels as many medications such as pain and cold medications may contain tylenol.  Do not drink alcohol while taking these medications.  Do not take other NSAID'S while taking ibuprofen (such as aleve or naproxen).  Please take ibuprofen with food to decrease stomach upset.  You are being prescribed a medication which may make you sleepy. For 24 hours after one dose please do not drive, operate heavy machinery, care for a small child with out another adult present, or perform any activities that may cause harm to you or someone else if you were to fall asleep or be impaired.    You may have diarrhea from the antibiotics.  It is very important that you continue to take the antibiotics even if you get diarrhea unless a medical professional tells you that you may stop taking them.  If you stop too early the bacteria you are being treated for will become stronger and you may need different, more powerful antibiotics that have more side effects and worsening diarrhea.  Please stay well hydrated and consider probiotics as they may decrease the severity of your diarrhea.  Please be aware that if you take any hormonal contraception (birth control pills, nexplanon, the ring, etc) that your birth control will not work while you are taking antibiotics and you need to use back up protection as directed on the birth control medication information insert.

## 2021-02-06 NOTE — ED Triage Notes (Addendum)
Pt c/o vaginal abscess x 3 days-states she was seen at [facility] P[facility]-NAD-steady gait

## 2021-07-08 ENCOUNTER — Encounter (INDEPENDENT_AMBULATORY_CARE_PROVIDER_SITE_OTHER): Payer: BC Managed Care – PPO | Admitting: Ophthalmology

## 2021-11-08 ENCOUNTER — Ambulatory Visit
Admission: EM | Admit: 2021-11-08 | Discharge: 2021-11-08 | Disposition: A | Discharge status: Home / Self Care | Payer: BC Managed Care – PPO | Attending: Urgent Care | Admitting: Urgent Care

## 2021-11-08 DIAGNOSIS — N912 Amenorrhea, unspecified: Secondary | ICD-10-CM

## 2021-11-08 LAB — POCT URINALYSIS DIP (MANUAL ENTRY)
Bilirubin, UA: NEGATIVE
Glucose, UA: NEGATIVE mg/dL
Ketones, POC UA: NEGATIVE mg/dL
Leukocytes, UA: NEGATIVE
Nitrite, UA: NEGATIVE
Protein Ur, POC: 30 mg/dL — AB
Spec Grav, UA: >=1.03 — AB (ref 1.010–1.025)
Urobilinogen, UA: 1 E.U./dL
pH, UA: 6 (ref 5.0–8.0)

## 2021-11-08 LAB — POCT URINE PREGNANCY: Preg Test, Ur: NEGATIVE

## 2021-11-08 NOTE — ED Triage Notes (Signed)
Pt states she has not had a regular length menstrual cycle (5-7 days). She reports her last cycle in May was lighter than normal and was only about 2 days long. The patient states the at home pregnancy test was negative (2 days ago) and she has been experiencing fatigue for two weeks.

## 2021-12-28 ENCOUNTER — Emergency Department (HOSPITAL_COMMUNITY)
Admission: EM | Admit: 2021-12-28 | Discharge: 2021-12-28 | Disposition: A | Discharge status: Home / Self Care | Payer: BC Managed Care – PPO | Attending: Emergency Medicine | Admitting: Emergency Medicine

## 2021-12-28 ENCOUNTER — Encounter (HOSPITAL_COMMUNITY): Payer: Self-pay

## 2021-12-28 ENCOUNTER — Other Ambulatory Visit: Payer: Self-pay

## 2021-12-28 ENCOUNTER — Emergency Department (HOSPITAL_COMMUNITY): Payer: BC Managed Care – PPO

## 2021-12-28 DIAGNOSIS — R1013 Epigastric pain: Secondary | ICD-10-CM | POA: Diagnosis not present

## 2021-12-28 DIAGNOSIS — R42 Dizziness and giddiness: Secondary | ICD-10-CM | POA: Diagnosis not present

## 2021-12-28 DIAGNOSIS — R112 Nausea with vomiting, unspecified: Secondary | ICD-10-CM | POA: Diagnosis present

## 2021-12-28 DIAGNOSIS — E876 Hypokalemia: Secondary | ICD-10-CM | POA: Diagnosis not present

## 2021-12-28 DIAGNOSIS — U071 COVID-19: Secondary | ICD-10-CM | POA: Diagnosis not present

## 2021-12-28 DIAGNOSIS — R1010 Upper abdominal pain, unspecified: Secondary | ICD-10-CM

## 2021-12-28 LAB — I-STAT BETA HCG BLOOD, ED (MC, WL, AP ONLY): I-stat hCG, quantitative: <5 m[IU]/mL (ref <5)

## 2021-12-28 LAB — COMPREHENSIVE METABOLIC PANEL
ALT: 18 U/L (ref 0–44)
AST: 24 U/L (ref 15–41)
Albumin: 3.5 g/dL (ref 3.5–5.0)
Alkaline Phosphatase: 44 U/L (ref 38–126)
Anion gap: 7 (ref 5–15)
BUN: 10 mg/dL (ref 6–20)
CO2: 22 mmol/L (ref 22–32)
Calcium: 8.4 mg/dL — ABNORMAL LOW (ref 8.9–10.3)
Chloride: 110 mmol/L (ref 98–111)
Creatinine, Ser: 0.63 mg/dL (ref 0.44–1.00)
GFR, Estimated: >60 mL/min (ref >60)
Glucose, Bld: 105 mg/dL — ABNORMAL HIGH (ref 70–99)
Potassium: 2.8 mmol/L — ABNORMAL LOW (ref 3.5–5.1)
Sodium: 139 mmol/L (ref 135–145)
Total Bilirubin: 0.6 mg/dL (ref 0.3–1.2)
Total Protein: 6.9 g/dL (ref 6.5–8.1)

## 2021-12-28 LAB — SARS CORONAVIRUS 2 BY RT PCR: SARS Coronavirus 2 by RT PCR: POSITIVE — AB

## 2021-12-28 LAB — URINALYSIS, ROUTINE W REFLEX MICROSCOPIC
Bilirubin Urine: NEGATIVE
Glucose, UA: NEGATIVE mg/dL
Hgb urine dipstick: NEGATIVE
Ketones, ur: 20 mg/dL — AB
Leukocytes,Ua: NEGATIVE
Nitrite: NEGATIVE
Protein, ur: NEGATIVE mg/dL
Specific Gravity, Urine: 1.023 (ref 1.005–1.030)
pH: 7 (ref 5.0–8.0)

## 2021-12-28 LAB — CBC WITH DIFFERENTIAL/PLATELET
Abs Immature Granulocytes: 0.02 10*3/uL (ref 0.00–0.07)
Basophils Absolute: 0 10*3/uL (ref 0.0–0.1)
Basophils Relative: 1 %
Eosinophils Absolute: 0 10*3/uL (ref 0.0–0.5)
Eosinophils Relative: 0 %
HCT: 34.2 % — ABNORMAL LOW (ref 36.0–46.0)
Hemoglobin: 11.6 g/dL — ABNORMAL LOW (ref 12.0–15.0)
Immature Granulocytes: 0 %
Lymphocytes Relative: 6 %
Lymphs Abs: 0.4 10*3/uL — ABNORMAL LOW (ref 0.7–4.0)
MCH: 30.2 pg (ref 26.0–34.0)
MCHC: 33.9 g/dL (ref 30.0–36.0)
MCV: 89.1 fL (ref 80.0–100.0)
Monocytes Absolute: 0.4 10*3/uL (ref 0.1–1.0)
Monocytes Relative: 6 %
Neutro Abs: 5.4 10*3/uL (ref 1.7–7.7)
Neutrophils Relative %: 87 %
Platelets: 325 10*3/uL (ref 150–400)
RBC: 3.84 MIL/uL — ABNORMAL LOW (ref 3.87–5.11)
RDW: 13.1 % (ref 11.5–15.5)
WBC: 6.2 10*3/uL (ref 4.0–10.5)
nRBC: 0 % (ref 0.0–0.2)

## 2021-12-28 LAB — LIPASE, BLOOD: Lipase: 33 U/L (ref 11–51)

## 2021-12-28 MED ORDER — ONDANSETRON HCL 4 MG PO TABS: 4.0000 mg | ORAL_TABLET | Freq: Four times a day (QID) | ORAL | 0 refills | Status: AC | PRN

## 2021-12-28 MED ORDER — NIRMATRELVIR/RITONAVIR (PAXLOVID)TABLET: 3.0000 | ORAL_TABLET | Freq: Two times a day (BID) | ORAL | 0 refills | Status: AC

## 2021-12-28 MED ORDER — POTASSIUM CHLORIDE CRYS ER 20 MEQ PO TBCR
40.0000 meq | EXTENDED_RELEASE_TABLET | Freq: Once | ORAL | Status: AC
  Administered 2021-12-28: 40 meq via ORAL
  Filled 2021-12-28: qty 2

## 2021-12-28 MED ORDER — ONDANSETRON HCL 4 MG/2ML IJ SOLN
4.0000 mg | Freq: Once | INTRAMUSCULAR | Status: AC
  Administered 2021-12-28: 4 mg via INTRAVENOUS
  Filled 2021-12-28: qty 2

## 2021-12-28 MED ORDER — NIRMATRELVIR/RITONAVIR (PAXLOVID)TABLET: 3.0000 | ORAL_TABLET | Freq: Two times a day (BID) | ORAL | 0 refills | Status: DC

## 2021-12-28 MED ORDER — SODIUM CHLORIDE 0.9 % IV BOLUS
1000.0000 mL | Freq: Once | INTRAVENOUS | Status: AC
  Administered 2021-12-28: 1000 mL via INTRAVENOUS

## 2021-12-28 MED ORDER — IOHEXOL 300 MG/ML  SOLN: 100.0000 mL | Freq: Once | INTRAMUSCULAR | Status: DC | PRN

## 2021-12-28 MED ORDER — POTASSIUM CHLORIDE 10 MEQ/100ML IV SOLN
10.0000 meq | INTRAVENOUS | Status: AC
  Administered 2021-12-28 (×2): 10 meq via INTRAVENOUS
  Filled 2021-12-28 (×2): qty 100

## 2021-12-28 NOTE — Discharge Instructions (Addendum)
It was a pleasure taking care of you!   Your labs showed low potassium, it was repleted in the ED.  Your COVID swab was positive for COVID.  According to the CDC, you must self quarantine for 5 days from the start of your symptoms.  Your quarantine period ends on 01/02/22.  You may take over-the-counter cough and cold medications as needed for your symptoms. Ensure to maintain fluid intake with tea, soup, broth, Pedialyte, Gatorade, water. You may follow-up with your primary care provider as needed.  Return to the Emergency Department if you are experiencing trouble breathing, chest pain, decreased fluid intake, vomiting, or worsening symptoms.

## 2021-12-28 NOTE — ED Triage Notes (Addendum)
Pt coming from home via EMS with c/o emesis x1 day. Pt reports she has also been feeling dizzy. Pt reports her period is late.

## 2021-12-28 NOTE — ED Provider Notes (Signed)
[facility]-EMERGENCY DEPT Provider Note   CSN: [id number] Arrival date & time: 12/28/21  1810     History  Chief Complaint  Patient presents with   Emesis    Dana Wells is a 36 y.o. female who presents to the emergency department with concerns for emesis onset today.  Denies sick contacts.  Has associated upper abdominal pain.  Her last menstrual cycle was 11/15/2021.  Has associated dizziness, lightheadedness, nausea.  No meds tried prior to arrival.  Patient notes she still has her gallbladder and appendix.  Denies fever, urinary symptoms, vaginal bleeding, vaginal discharge. Patient does use marijuana however has not been able to use for the past 2 days.    The history is provided by the patient. No language interpreter was used.       Home Medications Prior to Admission medications   Medication Sig Start Date End Date Taking? Authorizing Provider  ondansetron (ZOFRAN) 4 MG tablet Take 1 tablet (4 mg total) by mouth every 6 (six) hours as needed for nausea or vomiting. 12/28/21  Yes Kahli Fitzgerald A, PA-C  Multiple Vitamin (MULTIVITAMIN WITH MINERALS) TABS tablet Take 1 tablet by mouth daily.    [provider]  nirmatrelvir/ritonavir EUA (PAXLOVID) 20 x 150 MG & 10 x 100MG  TABS Take 3 tablets by mouth 2 (two) times daily for 5 days. Patient GFR is >60. Take nirmatrelvir (150 mg) two tablets twice daily for 5 days and ritonavir (100 mg) one tablet twice daily for 5 days. 12/28/21 01/02/22  Hatsuko Bizzarro A, PA-C      Allergies    Patient has no known allergies.    Review of Systems   Review of Systems  Constitutional:  Negative for fever.  Gastrointestinal:  Positive for abdominal pain, nausea and vomiting.  Genitourinary:  Negative for dysuria, hematuria, vaginal bleeding and vaginal discharge.  Neurological:  Positive for dizziness and light-headedness.  All other systems reviewed and are negative.   Physical Exam Updated Vital Signs BP (!)  100/58   Pulse 83   Temp 98.4 F (36.9 C) (Oral)   Resp 18   Ht 5\' 7"  (1.702 m)   Wt 73.9 kg   LMP 11/15/2021 (Exact Date)   SpO2 100%   BMI 25.53 kg/m  Physical Exam Vitals and nursing note reviewed.  Constitutional:      General: She is not in acute distress.    Appearance: She is not diaphoretic.  HENT:     Head: Normocephalic and atraumatic.     Mouth/Throat:     Pharynx: No oropharyngeal exudate.  Eyes:     General: No scleral icterus.    Conjunctiva/sclera: Conjunctivae normal.  Cardiovascular:     Rate and Rhythm: Normal rate and regular rhythm.     Pulses: Normal pulses.     Heart sounds: Normal heart sounds.  Pulmonary:     Effort: Pulmonary effort is normal. No respiratory distress.     Breath sounds: Normal breath sounds. No wheezing.  Abdominal:     General: Bowel sounds are normal.     Palpations: Abdomen is soft. There is no mass.     Tenderness: There is abdominal tenderness in the epigastric area and left upper quadrant. There is no guarding or rebound.     Comments: Mild tenderness to palpation noted to upper abdominal region.  Musculoskeletal:        General: Normal range of motion.     Cervical back: Normal range of motion and  neck supple.  Skin:    General: Skin is warm and dry.  Neurological:     Mental Status: She is alert.  Psychiatric:        Behavior: Behavior normal.     ED Results / Procedures / Treatments   Labs (all labs ordered are listed, but only abnormal results are displayed) Labs Reviewed  SARS CORONAVIRUS 2 BY RT PCR - Abnormal; Notable for the following components:      Result Value   SARS Coronavirus 2 by RT PCR POSITIVE (*)    All other components within normal limits  CBC WITH DIFFERENTIAL/PLATELET - Abnormal; Notable for the following components:   RBC 3.84 (*)    Hemoglobin 11.6 (*)    HCT 34.2 (*)    Lymphs Abs 0.4 (*)    All other components within normal limits  COMPREHENSIVE METABOLIC PANEL - Abnormal; Notable  for the following components:   Potassium 2.8 (*)    Glucose, Bld 105 (*)    Calcium 8.4 (*)    All other components within normal limits  URINALYSIS, ROUTINE W REFLEX MICROSCOPIC - Abnormal; Notable for the following components:   Ketones, ur 20 (*)    All other components within normal limits  LIPASE, BLOOD  I-STAT BETA HCG BLOOD, ED ([facility], [facility], [facility] ONLY)    EKG None  Radiology No results found.  Procedures Procedures    Medications Ordered in ED Medications  potassium chloride 10 mEq in 100 mL IVPB (10 mEq Intravenous New Bag/Given 12/28/21 2113)  iohexol (OMNIPAQUE) 300 MG/ML solution 100 mL (has no administration in time range)  sodium chloride 0.9 % bolus 1,000 mL (0 mLs Intravenous Stopped 12/28/21 2120)  ondansetron (ZOFRAN) injection 4 mg (4 mg Intravenous Given 12/28/21 1857)  potassium chloride SA (KLOR-CON M) CR tablet 40 mEq (40 mEq Oral Given 12/28/21 1943)    ED Course/ Medical Decision Making/ A&P Clinical Course as of 12/28/21 2205  Mon Dec 28, 2021  1910 Potassium(!): 2.8 [SB]  2023 Pt re-evaluated and noted improvement of symptoms [SB]  2135 Pt re-evaluated asleep resting comfortably on the stretcher.  Discussed plans for CT scan with patient at bedside.  Patient agreeable at this time.   [SB]  2153 Patient reevaluated and noted that she would like to go home at this time.  Discussed with patient that she will complete out her potassium and pending COVID swab. Pt agreeable at this time.  [SB]  2157 SARS Coronavirus 2 by RT PCR(!): POSITIVE [SB]  2203 Discussed with patient positive COVID swab and discharge treatment plan.  Patient appears safe for discharge at this time. [SB]    Clinical Course User Index [SB] Nakia Koble A, PA-C                           Medical Decision Making Amount and/or Complexity of Data Reviewed Labs: ordered. Decision-making details documented in ED Course. Radiology: ordered.  Risk Prescription drug management.   Concerns  for emesis onset today.  Denies sick contacts.  Has associated upper abdominal pain.  Also notes dizziness, lightheadedness, nausea.  No meds tried prior to arrival.  No urinary symptoms or vaginal bleeding or discharge.  Patient afebrile.  Patient used marijuana however not been able to use for the past 2 days.  On exam patient with mild tenderness to palpation noted to upper abdominal region.  Otherwise no acute cardiovascular respiratory exam findings.  Differential diagnosis includes pancreatitis,  cholecystitis, diverticulitis, COVID-19.   Labs:  I ordered, and personally interpreted labs.  The pertinent results include:   Lipase at 33 and unremarkable. I-STAT beta-hCG unremarkable. Urinalysis unremarkable. CBC without leukocytosis. CMP with hypokalemia at 2.8 otherwise unremarkable. COVID swab ordered with results pending at time of signout.  Medications:  I ordered medication including IVF, Zofran, potassium for symptom management and potassium repletion. Reevaluation of the patient after these medicines and interventions, I reevaluated the patient and found that they have improved I have reviewed the patients home medicines and have made adjustments as needed  Disposition: Presentation suspicious for COVID-19.  Doubt pancreatitis, cholecystitis, diverticulitis at this time.  After consideration of the diagnostic results and the patients response to treatment, I feel that the patient would benefit from Discharge home. Discharge home with prescription Zofran. Pt also provided with prescription for Paxlovid. Supportive care measures and strict return precautions discussed with patient at bedside. Pt acknowledges and verbalizes understanding. Pt appears safe for discharge. Follow up as indicated in discharge paperwork.   This chart was dictated using voice recognition software, Dragon. Despite the best efforts of this provider to proofread and correct errors, errors may still occur which can  change documentation meaning.  This chart was dictated using voice recognition software, Dragon. Despite the best efforts of this provider to proofread and correct errors, errors may still occur which can change documentation meaning.   Final Clinical Impression(s) / ED Diagnoses Final diagnoses:  Nausea and vomiting, unspecified vomiting type  Upper abdominal pain  COVID-19    Rx / DC Orders ED Discharge Orders          Ordered    ondansetron (ZOFRAN) 4 MG tablet  Every 6 hours PRN        12/28/21 2155    nirmatrelvir/ritonavir EUA (PAXLOVID) 20 x 150 MG & 10 x 100MG  TABS  2 times daily,   Status:  Discontinued        12/28/21 2203    nirmatrelvir/ritonavir EUA (PAXLOVID) 20 x 150 MG & 10 x 100MG  TABS  2 times daily,   Status:  Discontinued        12/28/21 2203    nirmatrelvir/ritonavir EUA (PAXLOVID) 20 x 150 MG & 10 x 100MG  TABS  2 times daily        12/28/21 2205              Jaquilla Woodroof A, PA-C 12/28/21 2208    2206, MD 12/29/21 1113

## 2022-01-03 ENCOUNTER — Telehealth: Payer: BC Managed Care – PPO | Admitting: Family

## 2022-01-03 ENCOUNTER — Encounter: Payer: Self-pay | Admitting: Emergency Medicine

## 2022-01-03 ENCOUNTER — Ambulatory Visit
Admission: EM | Admit: 2022-01-03 | Discharge: 2022-01-03 | Disposition: A | Discharge status: Home / Self Care | Payer: BC Managed Care – PPO | Attending: Urgent Care | Admitting: Urgent Care

## 2022-01-03 DIAGNOSIS — U071 COVID-19: Secondary | ICD-10-CM

## 2022-01-03 DIAGNOSIS — R051 Acute cough: Secondary | ICD-10-CM | POA: Diagnosis not present

## 2022-01-03 DIAGNOSIS — R5381 Other malaise: Secondary | ICD-10-CM | POA: Diagnosis not present

## 2022-01-03 DIAGNOSIS — F17200 Nicotine dependence, unspecified, uncomplicated: Secondary | ICD-10-CM

## 2022-01-03 DIAGNOSIS — R0602 Shortness of breath: Secondary | ICD-10-CM

## 2022-01-03 DIAGNOSIS — R5383 Other fatigue: Secondary | ICD-10-CM

## 2022-01-03 MED ORDER — PREDNISONE 20 MG PO TABS: ORAL_TABLET | ORAL | 0 refills | Status: AC

## 2022-01-03 MED ORDER — PROMETHAZINE-DM 6.25-15 MG/5ML PO SYRP: 5.0000 mL | ORAL_SOLUTION | Freq: Three times a day (TID) | ORAL | 0 refills | Status: AC | PRN

## 2022-01-03 NOTE — ED Provider Notes (Signed)
[facility] - URGENT CARE CENTER   MRN: [id number] DOB: 07-25-1985  Subjective:   Dana Wells is a 36 y.o. female presenting for 6-day history of acute onset persistent and worsening malaise, fatigue, shortness of breath, coughing, throat pain, sinus headaches, body aches.  Patient tested positive for COVID-19 on the same day that she got sick 12/28/2021.  She underwent a course of Paxlovid and still feels ill.  She is a smoker but has not been able to smoke since being sick with COVID-19.  No current facility-administered medications for this encounter.  Current Outpatient Medications:    Multiple Vitamin (MULTIVITAMIN WITH MINERALS) TABS tablet, Take 1 tablet by mouth daily., Disp: , Rfl:    ondansetron (ZOFRAN) 4 MG tablet, Take 1 tablet (4 mg total) by mouth every 6 (six) hours as needed for nausea or vomiting., Disp: 9 tablet, Rfl: 0   No Known Allergies  Past Medical History:  Diagnosis Date   Anxiety    History of recurrent urinary tract infection    Tobacco use disorder      Past Surgical History:  Procedure Laterality Date   THERAPEUTIC ABORTION  08/2011    History reviewed. No pertinent family history.  Social History   Tobacco Use   Smoking status: Former    Packs/day: 0.50    Types: Cigarettes    Quit date: 10/19/2021    Years since quitting: 0.2   Smokeless tobacco: Never  Substance Use Topics   Alcohol use: Yes    Alcohol/week: 1.0 standard drink of alcohol    Types: 1 Standard drinks or equivalent per week    Comment: daily   Drug use: Yes    Frequency: 4.0 times per week    Types: Marijuana    ROS   Objective:   Vitals: BP 107/72 (BP Location: Left Arm)   Pulse 74   Temp 98 F (36.7 C) (Oral)   Resp 20   LMP 11/15/2021 (Exact Date)   SpO2 98%   Physical Exam Constitutional:      General: She is not in acute distress.    Appearance: Normal appearance. She is well-developed. She is not ill-appearing, toxic-appearing or diaphoretic.   HENT:     Head: Normocephalic and atraumatic.     Nose: Nose normal.     Mouth/Throat:     Mouth: Mucous membranes are moist.  Eyes:     General: No scleral icterus.       Right eye: No discharge.        Left eye: No discharge.     Extraocular Movements: Extraocular movements intact.  Cardiovascular:     Rate and Rhythm: Normal rate and regular rhythm.     Heart sounds: Normal heart sounds. No murmur heard.    No friction rub. No gallop.  Pulmonary:     Effort: Pulmonary effort is normal. No respiratory distress.     Breath sounds: No stridor. No wheezing, rhonchi or rales.  Chest:     Chest wall: No tenderness.  Skin:    General: Skin is warm and dry.  Neurological:     General: No focal deficit present.     Mental Status: She is alert and oriented to person, place, and time.  Psychiatric:        Mood and Affect: Mood normal.        Behavior: Behavior normal.     Assessment and Plan :   PDMP not reviewed this encounter.  1. COVID-19 virus infection  2. Acute cough   3. Malaise and fatigue   4. Shortness of breath   5. Smoker    Patient has completed a course of Paxlovid.  I offered an oral course of prednisone in the context of her smoking and respiratory symptoms.  She requested extended time from work and I provided this to her.  I also notified her that as an urgent care we are unable to complete short-term disability and/or FMLA.  Advised that she pursue this with her primary care provider should her employer requires this. Deferred imaging given clear cardiopulmonary exam, hemodynamically stable vital signs. Counseled patient on potential for adverse effects with medications prescribed/recommended today, ER and return-to-clinic precautions discussed, patient verbalized understanding.    Wallis Bamberg, [place] 01/03/22 1317

## 2022-01-03 NOTE — Progress Notes (Signed)
Because of your shortness of breath you need to be seen in person to rule out pneumonia, I feel your condition warrants further evaluation and I recommend that you be seen in a face to face visit.   NOTE: There will be NO CHARGE for this eVisit   If you are having a true medical emergency please call 911.      For an urgent face to face visit, [facility] has seven urgent care centers for your convenience:     [facility] Health [facility] Center at [facility] Directions [phone] [address] [place], [place] [address]    [facility] Health Urgent Care Center [facility]) Get Driving Directions [phone] [address] [place], [place] [address]  [facility] Health Urgent Care Center [facility]) Get Driving Directions [phone] [address] [place],  [place]  [address]  [facility] Health Urgent Care Center [facility] Directions  [phone] [address] [place],  [place] [address]   [facility] Health Urgent Care at [facility] Get Driving Directions [phone] [address], [address] [place], [place] [address]   [facility] Health Urgent Care at [facility] Get Driving Directions  [phone] [address] [place], [place] [address]   [facility] Health Urgent Care at [facility] Directions [phone] [address], [address] [place], [place] [address]  Your MyChart E-visit questionnaire answers were reviewed by a board certified advanced clinical practitioner to complete your personal care plan based on your specific symptoms.  Thank you for using e-Visits.

## 2022-01-03 NOTE — ED Triage Notes (Addendum)
Pt COVID positive on 8/14. She was seen in the ED for N/V and dizziness on that same day. Pt was given paxlovid without any relief of sxs. Pt has cough, SOB, sore throat, headache and dizziness.

## 2023-04-21 ENCOUNTER — Ambulatory Visit: Payer: BC Managed Care – PPO | Admitting: Dermatology

## 2023-05-16 ENCOUNTER — Ambulatory Visit: Payer: BC Managed Care – PPO | Admitting: Dermatology

## 2023-09-15 ENCOUNTER — Ambulatory Visit: Payer: BC Managed Care – PPO | Admitting: Dermatology

## 2024-02-01 ENCOUNTER — Other Ambulatory Visit: Payer: Self-pay

## 2024-02-01 ENCOUNTER — Encounter (HOSPITAL_COMMUNITY): Payer: Self-pay

## 2024-02-01 ENCOUNTER — Emergency Department (HOSPITAL_COMMUNITY)
Admission: EM | Admit: 2024-02-01 | Discharge: 2024-02-01 | Disposition: A | Discharge status: Home / Self Care | Attending: Emergency Medicine | Admitting: Emergency Medicine

## 2024-02-01 ENCOUNTER — Emergency Department (HOSPITAL_COMMUNITY)

## 2024-02-01 DIAGNOSIS — B349 Viral infection, unspecified: Secondary | ICD-10-CM | POA: Insufficient documentation

## 2024-02-01 DIAGNOSIS — R1032 Left lower quadrant pain: Secondary | ICD-10-CM | POA: Diagnosis not present

## 2024-02-01 DIAGNOSIS — R059 Cough, unspecified: Secondary | ICD-10-CM | POA: Diagnosis present

## 2024-02-01 LAB — URINALYSIS, ROUTINE W REFLEX MICROSCOPIC
Bilirubin Urine: NEGATIVE
Glucose, UA: NEGATIVE mg/dL
Hgb urine dipstick: NEGATIVE
Ketones, ur: NEGATIVE mg/dL
Leukocytes,Ua: NEGATIVE
Nitrite: NEGATIVE
Protein, ur: NEGATIVE mg/dL
Specific Gravity, Urine: 1.025 (ref 1.005–1.030)
pH: 5 (ref 5.0–8.0)

## 2024-02-01 LAB — COMPREHENSIVE METABOLIC PANEL WITH GFR
ALT: 12 U/L (ref 0–44)
AST: 24 U/L (ref 15–41)
Albumin: 4.6 g/dL (ref 3.5–5.0)
Alkaline Phosphatase: 72 U/L (ref 38–126)
Anion gap: 14 (ref 5–15)
BUN: 11 mg/dL (ref 6–20)
CO2: 23 mmol/L (ref 22–32)
Calcium: 9.8 mg/dL (ref 8.9–10.3)
Chloride: 101 mmol/L (ref 98–111)
Creatinine, Ser: 0.71 mg/dL (ref 0.44–1.00)
GFR, Estimated: >60 mL/min (ref >60)
Glucose, Bld: 88 mg/dL (ref 70–99)
Potassium: 3.8 mmol/L (ref 3.5–5.1)
Sodium: 138 mmol/L (ref 135–145)
Total Bilirubin: 0.9 mg/dL (ref 0.0–1.2)
Total Protein: 8.5 g/dL — ABNORMAL HIGH (ref 6.5–8.1)

## 2024-02-01 LAB — CBC WITH DIFFERENTIAL/PLATELET
Abs Immature Granulocytes: 0.02 K/uL (ref 0.00–0.07)
Basophils Absolute: 0.1 K/uL (ref 0.0–0.1)
Basophils Relative: 1 %
Eosinophils Absolute: 0.1 K/uL (ref 0.0–0.5)
Eosinophils Relative: 1 %
HCT: 41.7 % (ref 36.0–46.0)
Hemoglobin: 13.8 g/dL (ref 12.0–15.0)
Immature Granulocytes: 0 %
Lymphocytes Relative: 12 %
Lymphs Abs: 1 K/uL (ref 0.7–4.0)
MCH: 30.1 pg (ref 26.0–34.0)
MCHC: 33.1 g/dL (ref 30.0–36.0)
MCV: 90.8 fL (ref 80.0–100.0)
Monocytes Absolute: 0.4 K/uL (ref 0.1–1.0)
Monocytes Relative: 4 %
Neutro Abs: 6.8 K/uL (ref 1.7–7.7)
Neutrophils Relative %: 82 %
Platelets: 409 K/uL — ABNORMAL HIGH (ref 150–400)
RBC: 4.59 MIL/uL (ref 3.87–5.11)
RDW: 12.5 % (ref 11.5–15.5)
WBC: 8.3 K/uL (ref 4.0–10.5)
nRBC: 0 % (ref 0.0–0.2)

## 2024-02-01 LAB — RESP PANEL BY RT-PCR (RSV, FLU A&B, COVID)  RVPGX2
Influenza A by PCR: NEGATIVE
Influenza B by PCR: NEGATIVE
Resp Syncytial Virus by PCR: NEGATIVE
SARS Coronavirus 2 by RT PCR: NEGATIVE

## 2024-02-01 LAB — PREGNANCY, URINE: Preg Test, Ur: NEGATIVE

## 2024-02-01 LAB — LIPASE, BLOOD: Lipase: 31 U/L (ref 11–51)

## 2024-02-01 MED ORDER — IOHEXOL 300 MG/ML  SOLN
100.0000 mL | Freq: Once | INTRAMUSCULAR | Status: AC | PRN
  Administered 2024-02-01: 100 mL via INTRAVENOUS

## 2024-02-01 NOTE — ED Triage Notes (Signed)
 Loss of taste and smell, nasal congestion, sore throat. C/o left sided abdominal pain. States she felt the same when she had covid in the past.

## 2024-02-01 NOTE — ED Provider Triage Note (Signed)
 Emergency Medicine Provider Triage Evaluation Note  Dana Wells , a 38 y.o. female  was evaluated in triage.  Pt complains of left lower quadrant abdominal pain, nasal congestion, fever, sore throat.  Nasal congestion, sore throat started first.  Abdominal pain started in last 24 hours.  Seen urgent care and sent here to rule out diverticulitis.  Patient does not have a history of diverticulosis.  She denies any urinary symptoms at this time.  Review of Systems  Positive:  Negative: See above   Physical Exam  BP 124/80 (BP Location: Left Arm)   Pulse 75   Temp 98.6 F (37 C) (Oral)   Resp 16   Ht 5' 7 (1.702 m)   Wt 83 kg   SpO2 100%   BMI 28.66 kg/m  Gen:   Awake, no distress   Resp:  Normal effort  MSK:   Moves extremities without difficulty  Other:  Mild tenderness diffusely but moderate tenderness in left lower quadrant.  No obvious tonsillar abscess.  Medical Decision Making  Medically screening exam initiated at 5:36 PM.  Appropriate orders placed.  CARIS CERVENY was informed that the remainder of the evaluation will be completed by another provider, this initial triage assessment does not replace that evaluation, and the importance of remaining in the ED until their evaluation is complete.     Theotis Peers [place], [place] 02/01/24 1737

## 2024-02-01 NOTE — ED Provider Notes (Signed)
 [place] EMERGENCY DEPARTMENT AT [facility] Provider Note   CSN: [id number] Arrival date & time: 02/01/24  1708     Patient presents with: Nasal Congestion and Abdominal Pain   Dana Wells is a 38 y.o. female presents ED with abdominal pain.  Patient reports she has had 2 days of congestion, runny nose, cough, abdominal pain is mostly left lower sided.  Her last bowel movement was 4 days ago which is not atypical for her.  She has chronic constipation.  Denies vomiting.  She is passing gas.  She states he went to urgent care was told to come to ED for further evaluation due to her lower abdominal pain.  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 Authorizing Provider  Multiple Vitamin (MULTIVITAMIN WITH MINERALS) TABS tablet Take 1 tablet by mouth daily.    [provider]  ondansetron  (ZOFRAN ) 4 MG tablet Take 1 tablet (4 mg total) by mouth every 6 (six) hours as needed for nausea or vomiting. 12/28/21   Blue, Soijett A, PA-C  predniSONE  (DELTASONE ) 20 MG tablet Take 2 tablets daily with breakfast. 01/03/22   Christopher Savannah, PA-C  promethazine -dextromethorphan (PROMETHAZINE -DM) 6.25-15 MG/5ML syrup Take 5 mLs by mouth 3 (three) times daily as needed for cough. 01/03/22   Christopher Savannah, PA-C    Allergies: Patient has no known allergies.    Review of Systems  Updated Vital Signs BP 124/65 (BP Location: Left Arm)   Pulse 76   Temp 98.4 F (36.9 C) (Oral)   Resp 16   Ht 5' 7 (1.702 m)   Wt 83 kg   LMP 01/18/2024   SpO2 100%   BMI 28.66 kg/m   Physical Exam Constitutional:      General: She is not in acute distress. HENT:     Head: Normocephalic and atraumatic.  Eyes:     Conjunctiva/sclera: Conjunctivae normal.     Pupils: Pupils are equal, round, and reactive to light.  Cardiovascular:     Rate and Rhythm: Normal rate  and regular rhythm.  Pulmonary:     Effort: Pulmonary effort is normal. No respiratory distress.  Abdominal:     General: There is no distension.     Tenderness: There is abdominal tenderness in the left lower quadrant.  Skin:    General: Skin is warm and dry.  Neurological:     General: No focal deficit present.     Mental Status: She is alert. Mental status is at baseline.  Psychiatric:        Mood and Affect: Mood normal.        Behavior: Behavior normal.     (all labs ordered are listed, but only abnormal results are displayed) Labs Reviewed  CBC WITH DIFFERENTIAL/PLATELET - Abnormal; Notable for the following components:      Result Value   Platelets 409 (*)    All other components within normal limits  COMPREHENSIVE METABOLIC PANEL WITH GFR - Abnormal; Notable for the following components:   Total Protein 8.5 (*)    All other components within normal limits  RESP PANEL BY RT-PCR (RSV, FLU A&B, COVID)  RVPGX2  LIPASE, BLOOD  URINALYSIS, ROUTINE W REFLEX MICROSCOPIC  PREGNANCY, URINE    EKG: None  Radiology: CT ABDOMEN PELVIS W CONTRAST Result Date: 02/01/2024 CLINICAL DATA:  Left lower quadrant pain EXAM: CT ABDOMEN AND PELVIS WITH CONTRAST TECHNIQUE: Multidetector CT imaging of the abdomen and pelvis was performed using the standard protocol following bolus administration of intravenous contrast. RADIATION DOSE REDUCTION: This exam was performed according to the departmental dose-optimization program which includes automated exposure control, adjustment of the mA and/or kV according to patient size and/or use of iterative reconstruction technique. CONTRAST:  OMNIPAQUE  IOHEXOL  300 MG/ML  SOLN COMPARISON:  07/28/2012 FINDINGS: Lower chest: No acute abnormality. Hepatobiliary: No focal liver abnormality is seen. No gallstones, gallbladder wall thickening, or biliary dilatation. Pancreas: Unremarkable. No pancreatic ductal dilatation or surrounding inflammatory changes.  Spleen: Normal in size without focal abnormality. Adrenals/Urinary Tract: Adrenal glands are within normal limits. Kidneys demonstrate a normal enhancement pattern bilaterally. No renal calculi or obstructive changes are noted. Bladder is decompressed. Stomach/Bowel: No obstructive or inflammatory changes of the colon are noted. Fecal material is noted throughout the colon however consistent with a mild degree of constipation. The appendix is air-filled. Small bowel and stomach are unremarkable. Vascular/Lymphatic: No significant vascular findings are present. No enlarged abdominal or pelvic lymph nodes. Reproductive: Uterus and bilateral adnexa are unremarkable. Other: Minimal free fluid is noted in the pelvic cul-de-sac likely physiologic in nature. This is similar to the prior exam. Musculoskeletal: No acute or significant osseous findings. IMPRESSION: No acute abnormality correspond with the given clinical history. Mild colonic constipation. Electronically Signed   By: Oneil Devonshire M.D.   On: 02/01/2024 20:08     Procedures   Medications Ordered in the ED  iohexol  (OMNIPAQUE ) 300 MG/ML solution 100 mL (100 mLs Intravenous Contrast Given 02/01/24 1921)                                    Medical Decision Making  Differential includes viral illness most likely versus diverticulitis or colitis versus pancreatitis versus UTI versus other  With constellation of symptoms and multiple system involvement is most suggestive of viral illness.  Patient is clinically well-appearing with normal vital signs on arrival.  I personally viewed interpret the patient's labs and imaging.  There are no emergent findings here.  She may have some mild constipation.  We discussed gentle laxatives, staying hydrated at home.  No indication for antibiotics at this time.  The patient is comfortable with this workup and will be discharged.  Low suspicion for PID or emergent pelvic pathology     Final diagnoses:  Viral  illness  Left lower quadrant abdominal pain    ED Discharge Orders     None          Cottie Donnice PARAS, MD 02/01/24 2223

## 2024-05-22 ENCOUNTER — Ambulatory Visit: Admitting: Physician Assistant

## 2024-10-15 ENCOUNTER — Ambulatory Visit: Admitting: Physician Assistant
